# Patient Record
Sex: Male | Born: 1935 | Race: White | Hispanic: No | Marital: Married | Smoking: Never smoker
Health system: Southern US, Community
[De-identification: ages and names within clinical notes are randomized; demographics above are authoritative.]

## PROBLEM LIST (undated history)

## (undated) DIAGNOSIS — I10 Essential (primary) hypertension: Secondary | ICD-10-CM

## (undated) DIAGNOSIS — R188 Other ascites: Secondary | ICD-10-CM

## (undated) DIAGNOSIS — F0390 Unspecified dementia without behavioral disturbance: Secondary | ICD-10-CM

## (undated) DIAGNOSIS — K7460 Unspecified cirrhosis of liver: Secondary | ICD-10-CM

## (undated) DIAGNOSIS — I4891 Unspecified atrial fibrillation: Principal | ICD-10-CM

## (undated) DIAGNOSIS — I499 Cardiac arrhythmia, unspecified: Secondary | ICD-10-CM

## (undated) DIAGNOSIS — R011 Cardiac murmur, unspecified: Secondary | ICD-10-CM

## (undated) HISTORY — DX: Cardiac murmur, unspecified: R01.1

## (undated) HISTORY — DX: Unspecified atrial fibrillation: I48.91

## (undated) HISTORY — PX: JOINT REPLACEMENT: SHX530

## (undated) HISTORY — DX: Essential (primary) hypertension: I10

## (undated) HISTORY — PX: APPENDECTOMY: SHX54

---

## 2004-10-01 ENCOUNTER — Ambulatory Visit: Payer: Self-pay | Admitting: Gastroenterology

## 2008-03-20 ENCOUNTER — Ambulatory Visit: Payer: Self-pay | Admitting: Internal Medicine

## 2008-12-25 ENCOUNTER — Encounter: Payer: Self-pay | Admitting: Cardiovascular Disease

## 2008-12-25 ENCOUNTER — Inpatient Hospital Stay: Payer: Self-pay | Admitting: Internal Medicine

## 2009-03-27 ENCOUNTER — Ambulatory Visit: Payer: Self-pay | Admitting: Gastroenterology

## 2009-06-19 ENCOUNTER — Ambulatory Visit: Payer: Self-pay | Admitting: Cardiovascular Disease

## 2009-06-19 ENCOUNTER — Inpatient Hospital Stay: Payer: Self-pay | Admitting: Specialist

## 2009-07-02 ENCOUNTER — Encounter: Payer: Self-pay | Admitting: Cardiovascular Disease

## 2010-05-31 HISTORY — PX: TOTAL HIP ARTHROPLASTY: SHX124

## 2010-07-10 ENCOUNTER — Encounter: Payer: Self-pay | Admitting: Cardiovascular Disease

## 2010-08-13 ENCOUNTER — Ambulatory Visit: Payer: Self-pay | Admitting: Internal Medicine

## 2010-09-10 ENCOUNTER — Ambulatory Visit (INDEPENDENT_AMBULATORY_CARE_PROVIDER_SITE_OTHER): Payer: Medicare Other | Admitting: Cardiovascular Disease

## 2010-09-10 ENCOUNTER — Encounter: Payer: Self-pay | Admitting: Cardiovascular Disease

## 2010-09-10 DIAGNOSIS — R0600 Dyspnea, unspecified: Secondary | ICD-10-CM

## 2010-09-10 DIAGNOSIS — R0989 Other specified symptoms and signs involving the circulatory and respiratory systems: Secondary | ICD-10-CM

## 2010-09-10 DIAGNOSIS — I4891 Unspecified atrial fibrillation: Secondary | ICD-10-CM

## 2010-09-10 DIAGNOSIS — I10 Essential (primary) hypertension: Secondary | ICD-10-CM

## 2010-09-10 DIAGNOSIS — E785 Hyperlipidemia, unspecified: Secondary | ICD-10-CM | POA: Insufficient documentation

## 2010-09-10 MED ORDER — DIGOXIN 250 MCG PO TABS: 250.0000 ug | ORAL_TABLET | Freq: Every day | ORAL | Status: DC

## 2010-09-10 NOTE — Assessment & Plan Note (Signed)
We have asked him to watch his blood pressure as this is elevated mildly today. He does report adequate blood pressure at home though he does not check it on a regular basis.

## 2010-09-10 NOTE — Assessment & Plan Note (Signed)
His rate today is very fast. I suspect his rate has been poorly controlled for several months given his symptoms of shortness of breath. We will start digoxin 0.5 mg x1 day, then 0.25 mg daily. I asked him to monitor his heart rate and blood pressure for me. I suspect we will need to increase his metoprolol to 75 mg, possibly 50 mg b.i.d. For additional rate control. If his rate continues to be elevated, we can add a calcium channel blocker such as diltiazem.  It is concerning that his rate was previously well controlled and now it is elevated. His wife reports that he has significant stress since the loss of his grandson from a viral myocarditis.

## 2010-09-10 NOTE — Progress Notes (Signed)
   Patient ID: Mark Stephens, male    DOB: 1934/04/08, 77 y.o.   MRN: [id number]  HPI Comments: 77 year old male, patient of Dr. Leane Para a history of atrial fibrillation, hypertension, hyperlipidemia, GI bleed on warfarin in January 2000 and with INR of 7,  presents for evaluation of his shortness of breath,  and mitral valve regurgitation.  He reports that over the past several months, he is having worsening shortness of breath. His wife reports that he cannot do the things that he used to do such as hunting, walking [place] a [place], even his basic daily routine. Previously, he was doing well on his current medication regimen and he believes that something has significantly changed. He denies any edema, cough. He feels well when he sits and sleeps at night, no lightheadedness or dizziness.  Outside studies Suggest moderate mitral valve regurgitation with mild mitral valve prolapse, mildly dilated left atrium, one study suggesting moderate tricuspid valve regurgitation  EKG today showing atrial fibrillation with ventricular rate 130 beats per minute, unable to exclude old anteroseptal infarct     Review of Systems  Constitutional: Negative.   HENT: Negative.   Eyes: Negative.   Respiratory: Positive for shortness of breath.   Cardiovascular: Negative.   Gastrointestinal: Negative.   Musculoskeletal: Negative.   Skin: Negative.   Neurological: Negative.   Hematological: Negative.   Psychiatric/Behavioral: Negative.   All other systems reviewed and are negative.   BP 156/78  Pulse 130  Ht 5\' 11"  (1.803 m)  Wt 175 lb (79.379 kg)  BMI 24.41 kg/m2    Physical Exam  Nursing note and vitals reviewed. Constitutional: He is oriented to person, place, and time. He appears well-developed and well-nourished.  HENT:  Head: Normocephalic.  Nose: Nose normal.  Mouth/Throat: Oropharynx is clear and moist.  Eyes: Conjunctivae are normal.  Pupils are equal, round, and reactive to light.  Neck: Normal range of motion. Neck supple. No JVD present.  Cardiovascular: Normal heart sounds and intact distal pulses.  An irregularly irregular rhythm present. Tachycardia present.  Exam reveals no gallop and no friction rub.   No murmur heard. Pulmonary/Chest: Effort normal and breath sounds normal. No respiratory distress. He has no wheezes. He has no rales. He exhibits no tenderness.  Abdominal: Soft. Bowel sounds are normal. He exhibits no distension. There is no tenderness.  Musculoskeletal: Normal range of motion. He exhibits no edema and no tenderness.  Lymphadenopathy:    He has no cervical adenopathy.  Neurological: He is alert and oriented to person, place, and time. Coordination normal.  Skin: Skin is warm and dry. No rash noted. No erythema.  Psychiatric: He has a normal mood and affect. His behavior is normal. Judgment and thought content normal.           Assessment and Plan

## 2010-09-10 NOTE — Assessment & Plan Note (Signed)
His shortness of breath is likely secondary to his underlying atrial fibrillation with RVR. He may have a significant contribution from his mitral valve regurgitation. If he does not have improvement of his symptoms with rate control, we will have to repeat a echocardiogram, possibly even a stress test.

## 2010-09-10 NOTE — Patient Instructions (Signed)
Start Digoxin 0.25mg  once daily. Take 2 tablets today only, then take 1 tablet once daily thereafter. Monitor your heart rate and write down for about a week and call our office with these numbers. Your physician recommends that you schedule a follow-up appointment in: 1 month

## 2010-09-10 NOTE — Assessment & Plan Note (Signed)
He reports cholesterol 220 or greater on a regular basis. We will talk to him about his cholesterol level at a later date. He reports his father did have "hardening of the arteries."

## 2010-10-14 ENCOUNTER — Ambulatory Visit (INDEPENDENT_AMBULATORY_CARE_PROVIDER_SITE_OTHER): Payer: Medicare Other | Admitting: Cardiovascular Disease

## 2010-10-14 ENCOUNTER — Encounter: Payer: Self-pay | Admitting: Cardiovascular Disease

## 2010-10-14 DIAGNOSIS — E785 Hyperlipidemia, unspecified: Secondary | ICD-10-CM

## 2010-10-14 DIAGNOSIS — R0609 Other forms of dyspnea: Secondary | ICD-10-CM

## 2010-10-14 DIAGNOSIS — I4891 Unspecified atrial fibrillation: Secondary | ICD-10-CM

## 2010-10-14 DIAGNOSIS — I10 Essential (primary) hypertension: Secondary | ICD-10-CM

## 2010-10-14 DIAGNOSIS — R0600 Dyspnea, unspecified: Secondary | ICD-10-CM

## 2010-10-14 MED ORDER — DABIGATRAN ETEXILATE MESYLATE 75 MG PO CAPS: 75.0000 mg | ORAL_CAPSULE | Freq: Two times a day (BID) | ORAL | Status: DC

## 2010-10-14 NOTE — Assessment & Plan Note (Signed)
Shortness of breath has improved with better rate control. As he is asymptomatic now, no need for a diuretic

## 2010-10-14 NOTE — Assessment & Plan Note (Signed)
We have encouraged continued exercise, careful diet management in an effort to lose weight. He is not interested in a cholesterol medication at this time.

## 2010-10-14 NOTE — Patient Instructions (Addendum)
You are doing well. Please start Pradaxa 75 mg twice a day. Hold your Aspirin while taking Pradaxa. Please call us if you have new issues that need to be addressed before your next appt.  We will call you for a follow up Appt. End of July 2012:

## 2010-10-14 NOTE — Assessment & Plan Note (Signed)
Pressure is elevated. We have suggested he monitor his blood pressure at home. Lisinopril can be titrated upward if needed

## 2010-10-14 NOTE — Assessment & Plan Note (Signed)
Heart rate has significantly improved on metoprolol, digoxin. No further changes were made. He is at risk of stroke given his chronic atrial fibrillation. He does have a history of GI bleeding though that was with a supratherapeutic INR. We did discuss the benefits of pradaxa. He will stay on low dose aspirin and start a half dose of pradaxa, 75 mg b.i.d. To start. We could possibly titrate this up later if there is no GI bleeding.

## 2010-10-14 NOTE — Progress Notes (Signed)
   Patient ID: Mark Stephens, male    DOB: 1933/07/12, 77 y.o.   MRN: [id number]  HPI Comments: 77 year old male, patient of Dr. Leane Para a history of atrial fibrillation, hypertension, hyperlipidemia, GI bleed on warfarin in January 2000 and with INR of 7,  h/o shortness of breath and mitral valve regurgitation, presents for routine follow up.  On his last clinic visit, he was started on digoxin for rate control. His rate was initially 130 beats per minute on his last office visit. He reports that currently, he feels much better and his heart rate has slowed down. He has less shortness of breath and has more energy. He is bothered by his hip and is taking Vicodin and receiving cortisone shots.  Outside echo Suggests moderate mitral valve regurgitation with mild mitral valve prolapse, mildly dilated left atrium, one study suggesting moderate tricuspid valve regurgitation  EKG today showing atrial fibrillation with ventricular rate 78 beats per minute, unable to exclude old anteroseptal infarct     Review of Systems  Constitutional: Negative.   HENT: Negative.   Eyes: Negative.   Respiratory: Negative.   Cardiovascular: Negative.   Gastrointestinal: Negative.   Musculoskeletal: Positive for arthralgias and gait problem.  Skin: Negative.   Neurological: Negative.   Hematological: Negative.   Psychiatric/Behavioral: Negative.   All other systems reviewed and are negative.    BP 155/80  Pulse 78  Ht 5\' 11"  (1.803 m)  Wt 172 lb (78.019 kg)  BMI 23.99 kg/m2   Physical Exam  Nursing note and vitals reviewed. Constitutional: He is oriented to person, place, and time. He appears well-developed and well-nourished.  HENT:  Head: Normocephalic.  Nose: Nose normal.  Mouth/Throat: Oropharynx is clear and moist.  Eyes: Conjunctivae are normal. Pupils are equal, round, and reactive to light.  Neck: Normal range of motion. Neck supple. No JVD present.  Cardiovascular: Normal rate,  S1 normal, S2 normal and intact distal pulses.  An irregularly irregular rhythm present. Exam reveals no gallop and no friction rub.   Murmur heard.  Systolic murmur is present with a grade of 2/6  Pulmonary/Chest: Effort normal and breath sounds normal. No respiratory distress. He has no wheezes. He has no rales. He exhibits no tenderness.  Abdominal: Soft. Bowel sounds are normal. He exhibits no distension. There is no tenderness.  Musculoskeletal: Normal range of motion. He exhibits no edema and no tenderness.  Lymphadenopathy:    He has no cervical adenopathy.  Neurological: He is alert and oriented to person, place, and time. Coordination normal.  Skin: Skin is warm and dry. No rash noted. No erythema.  Psychiatric: He has a normal mood and affect. His behavior is normal. Judgment and thought content normal.           Assessment and Plan

## 2010-11-05 ENCOUNTER — Encounter: Payer: Self-pay | Admitting: Cardiovascular Disease

## 2010-11-09 ENCOUNTER — Encounter: Payer: Self-pay | Admitting: Cardiovascular Disease

## 2010-12-11 ENCOUNTER — Other Ambulatory Visit: Payer: Self-pay | Admitting: *Deleted

## 2010-12-11 MED ORDER — DABIGATRAN ETEXILATE MESYLATE 75 MG PO CAPS: 75.0000 mg | ORAL_CAPSULE | Freq: Two times a day (BID) | ORAL | Status: DC

## 2010-12-11 NOTE — Telephone Encounter (Signed)
Pt's wife states that pt wants Pradaxa Rx be changed to [facility]. Pt was seen in office 09/2010. Notified wife I will send in Rx now.

## 2010-12-23 ENCOUNTER — Encounter: Payer: Self-pay | Admitting: Cardiovascular Disease

## 2010-12-23 ENCOUNTER — Encounter: Payer: Self-pay | Admitting: *Deleted

## 2010-12-23 ENCOUNTER — Ambulatory Visit (INDEPENDENT_AMBULATORY_CARE_PROVIDER_SITE_OTHER): Payer: Medicare Other | Admitting: Cardiovascular Disease

## 2010-12-23 VITALS — BP 140/90 | HR 63 | Ht 71.0 in | Wt 169.0 lb

## 2010-12-23 DIAGNOSIS — Z01810 Encounter for preprocedural cardiovascular examination: Secondary | ICD-10-CM | POA: Insufficient documentation

## 2010-12-23 DIAGNOSIS — I10 Essential (primary) hypertension: Secondary | ICD-10-CM

## 2010-12-23 DIAGNOSIS — E785 Hyperlipidemia, unspecified: Secondary | ICD-10-CM

## 2010-12-23 DIAGNOSIS — I4891 Unspecified atrial fibrillation: Secondary | ICD-10-CM

## 2010-12-23 NOTE — Assessment & Plan Note (Signed)
Blood pressure is well controlled on today's visit. No changes made to the medications. 

## 2010-12-23 NOTE — Progress Notes (Signed)
Patient ID: Mark Stephens, male    DOB: 01/26/1934, 77 y.o.   MRN: [id number]  HPI Comments: 77 year old male, patient of Dr. Leane Para a history of atrial fibrillation, hypertension, hyperlipidemia, GI bleed on warfarin in January 2000 and with INR of 7,  h/o shortness of breath and mitral valve regurgitation, presents for routine follow up.  He reports that he feels well. His weight has been relatively well controlled on his current medications. He did start Pradaxa one half of a 75 mg tablet b.i.d. For his atrial fibrillation. He did not want a full pill and was very nervous given his previous GI bleed history as well as nosebleed on warfarin. He denies any bleeding. He is scheduled to have hip replacement surgery. He denies any significant shortness of breath or chest pain with exertion.  Previous Outside echo Suggests moderate mitral valve regurgitation with mild mitral valve prolapse, mildly dilated left atrium, one study suggesting moderate tricuspid valve regurgitation  EKG today showing atrial fibrillation with ventricular rate 63 beats per minute, unable to exclude old anteroseptal infarct   Outpatient Encounter Prescriptions as of 12/23/2010  Medication Sig Dispense Refill  . allopurinol (ZYLOPRIM) 100 MG tablet Take 100 mg by mouth daily.        . dabigatran (PRADAXA) 75 MG CAPS Take 1 capsule (75 mg total) by mouth 2 (two) times daily.  180 capsule  3  . digoxin (LANOXIN) 0.25 MG tablet Take 1 tablet (250 mcg total) by mouth daily.  31 tablet  6  . lisinopril (PRINIVIL,ZESTRIL) 10 MG tablet Take 10 mg by mouth daily.        . metoprolol (TOPROL-XL) 50 MG 24 hr tablet Take 50 mg by mouth daily.        . Red Yeast Rice 600 MG CAPS Take 600 mg by mouth 1 day or 1 dose.        . Multiple Vitamins-Minerals (ICAPS MV PO) Take 1 capsule by mouth daily.           Review of Systems  Constitutional: Negative.   HENT: Negative.   Eyes: Negative.   Respiratory: Negative.     Cardiovascular: Negative.   Gastrointestinal: Negative.   Musculoskeletal: Positive for arthralgias and gait problem.  Skin: Negative.   Neurological: Negative.   Hematological: Negative.   Psychiatric/Behavioral: Negative.   All other systems reviewed and are negative.    BP 140/90  Pulse 63  Ht 5\' 11"  (1.803 m)  Wt 169 lb (76.658 kg)  BMI 23.57 kg/m2   Physical Exam  Nursing note and vitals reviewed. Constitutional: He is oriented to person, place, and time. He appears well-developed and well-nourished.  HENT:  Head: Normocephalic.  Nose: Nose normal.  Mouth/Throat: Oropharynx is clear and moist.  Eyes: Conjunctivae are normal. Pupils are equal, round, and reactive to light.  Neck: Normal range of motion. Neck supple. No JVD present.  Cardiovascular: Normal rate, S1 normal, S2 normal and intact distal pulses.  An irregularly irregular rhythm present. Exam reveals no gallop and no friction rub.   Murmur heard.  Systolic murmur is present with a grade of 2/6  Pulmonary/Chest: Effort normal and breath sounds normal. No respiratory distress. He has no wheezes. He has no rales. He exhibits no tenderness.  Abdominal: Soft. Bowel sounds are normal. He exhibits no distension. There is no tenderness.  Musculoskeletal: Normal range of motion. He exhibits no edema and no tenderness.  Lymphadenopathy:    He has no cervical adenopathy.  Neurological: He is alert and oriented to person, place, and time. Coordination normal.  Skin: Skin is warm and dry. No rash noted. No erythema.  Psychiatric: He has a normal mood and affect. His behavior is normal. Judgment and thought content normal.           Assessment and Plan

## 2010-12-23 NOTE — Assessment & Plan Note (Addendum)
Remains in atrial fibrillation, rate is well-controlled. Continue him on his current medications. We did suggest that he take a full pradaxa 75 mg tab BID. He is reluctant to do this given his history of bleeding in the past. We will check a digoxin level today.

## 2010-12-23 NOTE — Assessment & Plan Note (Signed)
He takes red yeast rice with mild improvement of his cholesterol. We have suggested he try to increase the dose every other day.

## 2010-12-23 NOTE — Patient Instructions (Signed)
You are doing well. No medication changes were made. Please stop pradaxa three days before your hip surgery. Restart pradaxa after the surgery Please call us if you have new issues that need to be addressed before your next appt.  We will call you for a follow up Appt. In 6 months

## 2010-12-23 NOTE — Assessment & Plan Note (Signed)
He would be a low/acceptable risk for his upcoming hip surgery. We have suggested he stop his anticoagulation 3 days prior to surgery and restart pradaxa as soon as possible after the surgery.

## 2010-12-24 LAB — DIGOXIN LEVEL: Digoxin Level: 1.7 ng/mL (ref 0.8–2.0)

## 2011-07-19 ENCOUNTER — Other Ambulatory Visit: Payer: Self-pay | Admitting: Cardiovascular Disease

## 2011-07-19 ENCOUNTER — Other Ambulatory Visit: Payer: Self-pay | Admitting: *Deleted

## 2011-07-19 MED ORDER — DIGOXIN 250 MCG PO TABS: 250.0000 ug | ORAL_TABLET | Freq: Every day | ORAL | Status: DC

## 2011-08-13 ENCOUNTER — Other Ambulatory Visit: Payer: Self-pay | Admitting: Cardiovascular Disease

## 2011-08-13 MED ORDER — DABIGATRAN ETEXILATE MESYLATE 75 MG PO CAPS: 75.0000 mg | ORAL_CAPSULE | Freq: Two times a day (BID) | ORAL | Status: DC

## 2011-08-13 NOTE — Telephone Encounter (Signed)
90days

## 2011-08-25 ENCOUNTER — Encounter: Payer: Self-pay | Admitting: Cardiovascular Disease

## 2011-08-25 ENCOUNTER — Ambulatory Visit (INDEPENDENT_AMBULATORY_CARE_PROVIDER_SITE_OTHER): Payer: Medicare Other | Admitting: Cardiovascular Disease

## 2011-08-25 VITALS — BP 142/88 | HR 89 | Ht 71.0 in | Wt 166.0 lb

## 2011-08-25 DIAGNOSIS — I4891 Unspecified atrial fibrillation: Secondary | ICD-10-CM

## 2011-08-25 DIAGNOSIS — Z79899 Other long term (current) drug therapy: Secondary | ICD-10-CM

## 2011-08-25 DIAGNOSIS — E785 Hyperlipidemia, unspecified: Secondary | ICD-10-CM

## 2011-08-25 DIAGNOSIS — I10 Essential (primary) hypertension: Secondary | ICD-10-CM

## 2011-08-25 MED ORDER — ATORVASTATIN CALCIUM 20 MG PO TABS: 20.0000 mg | ORAL_TABLET | Freq: Every day | ORAL | Status: DC

## 2011-08-25 NOTE — Patient Instructions (Signed)
You are doing well. Please start lipitor 1/2 pill for a few weeks then increase to a full pill.  Please call us if you have new issues that need to be addressed before your next appt.  Your physician wants you to follow-up in: 6 months.  You will receive a reminder letter in the mail two months in advance. If you don't receive a letter, please call our office to schedule the follow-up appointment.

## 2011-08-25 NOTE — Assessment & Plan Note (Signed)
Relatively well rate controlled. No medication changes made. We had a long discussion about increasing the dose of the pradaxa to 150 mg twice a day for stroke prevention. He would like to continue on a low dose given his previous history of GI bleed. He understands the risk of being on a low dose and possible stroke.

## 2011-08-25 NOTE — Assessment & Plan Note (Signed)
Blood pressure is adequate on today's visit. No changes made to the medications. I've asked him to watch his blood pressure at home.

## 2011-08-25 NOTE — Assessment & Plan Note (Signed)
We will start Lipitor 10 mg titrating to 20 mg daily as tolerated. He did not like red yeast rice as it caused gas.

## 2011-08-25 NOTE — Progress Notes (Signed)
Patient ID: Mark Stephens, male    DOB: 18-Nov-1933, 78 y.o.   MRN: [id number]  HPI Comments: 78 year old male, patient of Dr. Leane Para a history of atrial fibrillation, hypertension, hyperlipidemia, GI bleed on warfarin in January 2011 and with INR of 7,  h/o shortness of breath and mitral valve regurgitation, presents for routine follow up. He has had recent hip replacement surgery.  He did start Pradaxa one half of a 75 mg tablet b.i.d. For his atrial fibrillation. He did not want a full pill and was very nervous given his previous GI bleed history as well as nosebleed on warfarin. He denies any bleeding. He denieany significant shortness of breath or chest pain with exertion. he feels well.   Outside echo Suggests moderate mitral valve regurgitation with mild mitral valve prolapse, mildly dilated left atrium, one study suggesting moderate tricuspid valve regurgitation  EKG today showing atrial fibrillation with ventricular rate 89 beats per minute, unable to exclude old anteroseptal infarct   Outpatient Encounter Prescriptions as of 08/25/2011  Medication Sig Dispense Refill  . allopurinol (ZYLOPRIM) 100 MG tablet Take 100 mg by mouth daily.        . dabigatran (PRADAXA) 75 MG CAPS Take 1 capsule (75 mg total) by mouth 2 (two) times daily.  180 capsule  3  . digoxin (LANOXIN) 0.25 MG tablet Take 250 mcg by mouth daily. Take 1/2 pill daily      . lisinopril (PRINIVIL,ZESTRIL) 10 MG tablet Take 10 mg by mouth daily.        . metoprolol (TOPROL-XL) 50 MG 24 hr tablet Take 50 mg by mouth daily.        . Multiple Vitamins-Minerals (ICAPS MV PO) Take 1 capsule by mouth daily.          Review of Systems  Constitutional: Negative.   HENT: Negative.   Eyes: Negative.   Respiratory: Negative.   Cardiovascular: Negative.   Gastrointestinal: Negative.   Musculoskeletal: Positive for arthralgias and gait problem.  Skin: Negative.   Neurological: Negative.   Hematological: Negative.     Psychiatric/Behavioral: Negative.   All other systems reviewed and are negative.    BP 142/88  Pulse 89  Ht 5\' 11"  (1.803 m)  Wt 166 lb (75.297 kg)  BMI 23.15 kg/m2  Physical Exam  Nursing note and vitals reviewed. Constitutional: He is oriented to person, place, and time. He appears well-developed and well-nourished.  HENT:  Head: Normocephalic.  Nose: Nose normal.  Mouth/Throat: Oropharynx is clear and moist.  Eyes: Conjunctivae are normal. Pupils are equal, round, and reactive to light.  Neck: Normal range of motion. Neck supple. No JVD present.  Cardiovascular: Normal rate, S1 normal, S2 normal and intact distal pulses.  An irregularly irregular rhythm present. Exam reveals no gallop and no friction rub.   Murmur heard.  Systolic murmur is present with a grade of 2/6  Pulmonary/Chest: Effort normal and breath sounds normal. No respiratory distress. He has no wheezes. He has no rales. He exhibits no tenderness.  Abdominal: Soft. Bowel sounds are normal. He exhibits no distension. There is no tenderness.  Musculoskeletal: Normal range of motion. He exhibits no edema and no tenderness.  Lymphadenopathy:    He has no cervical adenopathy.  Neurological: He is alert and oriented to person, place, and time. Coordination normal.  Skin: Skin is warm and dry. No rash noted. No erythema.  Psychiatric: He has a normal mood and affect. His behavior is normal. Judgment and thought content  normal.           Assessment and Plan

## 2011-09-06 ENCOUNTER — Telehealth: Payer: Self-pay | Admitting: Cardiovascular Disease

## 2011-09-06 NOTE — Telephone Encounter (Signed)
Patient called to get results from digoxin level done 08/25/11.Patient told will send to [facility] for advice.

## 2011-09-06 NOTE — Telephone Encounter (Signed)
Digoxin level ok, little low but ok The higher dose digoxin caused the level to be too high So think we keep him on 0.125 mg daily for now

## 2011-09-06 NOTE — Telephone Encounter (Signed)
Pt calling for labs results from March

## 2011-09-07 NOTE — Telephone Encounter (Signed)
Advised patient

## 2012-02-04 ENCOUNTER — Other Ambulatory Visit: Payer: Self-pay

## 2012-02-04 MED ORDER — DABIGATRAN ETEXILATE MESYLATE 75 MG PO CAPS: 75.0000 mg | ORAL_CAPSULE | Freq: Two times a day (BID) | ORAL | Status: DC

## 2012-02-04 NOTE — Telephone Encounter (Signed)
Refill sent for pradaxa 75 mg take one tablet twice a day.

## 2012-02-16 ENCOUNTER — Other Ambulatory Visit: Payer: Self-pay | Admitting: Cardiovascular Disease

## 2012-02-16 MED ORDER — DABIGATRAN ETEXILATE MESYLATE 75 MG PO CAPS: 75.0000 mg | ORAL_CAPSULE | Freq: Two times a day (BID) | ORAL | Status: DC

## 2012-02-16 NOTE — Telephone Encounter (Signed)
Refilled Pradaxa. 

## 2012-02-17 ENCOUNTER — Other Ambulatory Visit: Payer: Self-pay

## 2012-02-17 ENCOUNTER — Encounter: Payer: Self-pay | Admitting: Cardiovascular Disease

## 2012-02-17 ENCOUNTER — Ambulatory Visit (INDEPENDENT_AMBULATORY_CARE_PROVIDER_SITE_OTHER): Payer: Medicare Other | Admitting: Cardiovascular Disease

## 2012-02-17 VITALS — BP 110/60 | HR 89 | Ht 71.0 in | Wt 167.2 lb

## 2012-02-17 DIAGNOSIS — E785 Hyperlipidemia, unspecified: Secondary | ICD-10-CM

## 2012-02-17 DIAGNOSIS — I4891 Unspecified atrial fibrillation: Secondary | ICD-10-CM

## 2012-02-17 DIAGNOSIS — I10 Essential (primary) hypertension: Secondary | ICD-10-CM

## 2012-02-17 MED ORDER — DABIGATRAN ETEXILATE MESYLATE 75 MG PO CAPS: 75.0000 mg | ORAL_CAPSULE | Freq: Two times a day (BID) | ORAL | Status: DC

## 2012-02-17 NOTE — Assessment & Plan Note (Signed)
Blood pressure borderline low. If this continues to run low, we have suggested he cut the lisinopril in half

## 2012-02-17 NOTE — Progress Notes (Signed)
Patient ID: Mark Stephens, male    DOB: [birth year], 78 y.o.   MRN: [id number]  HPI Comments: 78 year old male, patient of Dr. Leane Para a history of atrial fibrillation, hypertension, hyperlipidemia, GI bleed on warfarin in January 2011 and with INR of 7,  h/o shortness of breath and mitral valve regurgitation, presents for routine follow up. He has had recent hip replacement surgery.   He currently takes Pradaxa one tablet b.i.d. For his atrial fibrillation.  He denies any bleeding. He denies any significant shortness of breath or chest pain with exertion. he feels well.  He is not exercising on a regular basis. Sometimes his blood pressure runs low. He does occasionally forget his anticoagulation in the p.m. and is interested in daily dosing of xarelto.  Outside echo Suggests moderate mitral valve regurgitation with mild mitral valve prolapse, mildly dilated left atrium, one study suggesting moderate tricuspid valve regurgitation  EKG today showing atrial fibrillation with ventricular rate 89 beats per minute   Outpatient Encounter Prescriptions as of 02/17/2012  Medication Sig Dispense Refill  . allopurinol (ZYLOPRIM) 100 MG tablet Take 100 mg by mouth daily.        . dabigatran (PRADAXA) 75 MG CAPS Take 1 capsule (75 mg total) by mouth 2 (two) times daily.  180 capsule  3  . digoxin (LANOXIN) 0.25 MG tablet Take 250 mcg by mouth daily. Take 1/2 pill daily      . lisinopril (PRINIVIL,ZESTRIL) 10 MG tablet Take 10 mg by mouth daily.        . metoprolol (TOPROL-XL) 50 MG 24 hr tablet Take 50 mg by mouth daily.        . Multiple Vitamins-Minerals (ICAPS MV PO) Take 1 capsule by mouth daily.          Review of Systems  Constitutional: Negative.   HENT: Negative.   Eyes: Negative.   Respiratory: Negative.   Cardiovascular: Negative.   Gastrointestinal: Negative.   Musculoskeletal: Positive for arthralgias and gait problem.  Skin: Negative.   Neurological: Negative.     Hematological: Negative.   Psychiatric/Behavioral: Negative.   All other systems reviewed and are negative.    BP 110/60  Pulse 89  Ht 5\' 11"  (1.803 m)  Wt 167 lb 4 oz (75.864 kg)  BMI 23.33 kg/m2  Physical Exam  Nursing note and vitals reviewed. Constitutional: He is oriented to person, place, and time. He appears well-developed and well-nourished.  HENT:  Head: Normocephalic.  Nose: Nose normal.  Mouth/Throat: Oropharynx is clear and moist.  Eyes: Conjunctivae normal are normal. Pupils are equal, round, and reactive to light.  Neck: Normal range of motion. Neck supple. No JVD present.  Cardiovascular: Normal rate, S1 normal, S2 normal and intact distal pulses.  An irregularly irregular rhythm present. Exam reveals no gallop and no friction rub.   Murmur heard.  Systolic murmur is present with a grade of 2/6  Pulmonary/Chest: Effort normal and breath sounds normal. No respiratory distress. He has no wheezes. He has no rales. He exhibits no tenderness.  Abdominal: Soft. Bowel sounds are normal. He exhibits no distension. There is no tenderness.  Musculoskeletal: Normal range of motion. He exhibits no edema and no tenderness.  Lymphadenopathy:    He has no cervical adenopathy.  Neurological: He is alert and oriented to person, place, and time. Coordination normal.  Skin: Skin is warm and dry. No rash noted. No erythema.  Psychiatric: He has a normal mood and affect. His behavior is normal. Judgment  and thought content normal.           Assessment and Plan

## 2012-02-17 NOTE — Assessment & Plan Note (Signed)
He does not want a statin. We have suggested he continue red yeast rice

## 2012-02-17 NOTE — Assessment & Plan Note (Signed)
Heart rate is well controlled. He is on anticoagulation. 

## 2012-02-17 NOTE — Patient Instructions (Addendum)
You are doing well. If blood pressure runs low, cut the lisinopril in 1/2 daily  When you run out of pradaxa, start xarelto one a day If you feel ok on xarelto, call the office for a script  Please call us if you have new issues that need to be addressed before your next appt.  Your physician wants you to follow-up in: 6 months.  You will receive a reminder letter in the mail two months in advance. If you don't receive a letter, please call our office to schedule the follow-up appointment.

## 2012-03-15 ENCOUNTER — Encounter: Payer: Self-pay | Admitting: Internal Medicine

## 2012-07-15 ENCOUNTER — Other Ambulatory Visit: Payer: Self-pay

## 2012-08-09 ENCOUNTER — Ambulatory Visit (INDEPENDENT_AMBULATORY_CARE_PROVIDER_SITE_OTHER): Payer: Medicare Other | Admitting: Cardiovascular Disease

## 2012-08-09 ENCOUNTER — Encounter: Payer: Self-pay | Admitting: Cardiovascular Disease

## 2012-08-09 VITALS — BP 120/80 | HR 72 | Ht 71.0 in | Wt 163.5 lb

## 2012-08-09 DIAGNOSIS — I10 Essential (primary) hypertension: Secondary | ICD-10-CM

## 2012-08-09 DIAGNOSIS — F1020 Alcohol dependence, uncomplicated: Secondary | ICD-10-CM | POA: Insufficient documentation

## 2012-08-09 DIAGNOSIS — E785 Hyperlipidemia, unspecified: Secondary | ICD-10-CM

## 2012-08-09 DIAGNOSIS — I4891 Unspecified atrial fibrillation: Secondary | ICD-10-CM

## 2012-08-09 MED ORDER — RIVAROXABAN 20 MG PO TABS: 20.0000 mg | ORAL_TABLET | Freq: Every day | ORAL | Status: DC

## 2012-08-09 NOTE — Progress Notes (Signed)
Patient ID: Mark Stephens, male    DOB: [birth year], 79 y.o.   MRN: [id number]  HPI Comments: 79 year old male, patient of Dr. Randa Lynn, with a history of atrial fibrillation, hypertension, hyperlipidemia, GI bleed on warfarin in January 2011, with INR of 7,  h/o shortness of breath and mitral valve regurgitation, presents for routine follow up. prior hip replacement surgery.  He currently takes Pradaxa 75 mg one tablet b.i.d. For his atrial fibrillation.  He denies any bleeding.he did not want a higher dose pradaxa.  He is willing to try xarelto 20 mg daily. This was given to him in the past several months ago but he has not changed. He denies any significant shortness of breath or chest pain with exertion. he feels well.  He is not exercising on a regular basis. Legs are getting weaker, wife reports his balance is poor He continues to drink several glasses of wine per night but wife reports he is doing slightly better recently He does occasionally forget his anticoagulation in the p.m. and is interested in daily dosing of xarelto.  Outside echo Suggests moderate mitral valve regurgitation with mild mitral valve prolapse, mildly dilated left atrium, one study suggesting moderate tricuspid valve regurgitation  Total cholesterol 198, LDL 98, HDL 87 He does not want a cholesterol medication  EKG today showing atrial fibrillation with ventricular rate 72 beats per minute   Outpatient Encounter Prescriptions as of 08/09/2012  Medication Sig Dispense Refill  . allopurinol (ZYLOPRIM) 100 MG tablet Take 100 mg by mouth daily.        . digoxin (LANOXIN) 0.25 MG tablet Take 250 mcg by mouth daily. Take 1/2 pill daily      . lisinopril (PRINIVIL,ZESTRIL) 10 MG tablet Take 10 mg by mouth daily.        . metoprolol (TOPROL-XL) 50 MG 24 hr tablet Take 50 mg by mouth daily.        . Multiple Vitamins-Minerals (ICAPS MV PO) Take 1 capsule by mouth daily.        . Rivaroxaban (XARELTO) 20 MG TABS Take 1  tablet (20 mg total) by mouth daily.  90 tablet  3    Review of Systems  Constitutional: Negative.   HENT: Negative.   Eyes: Negative.   Respiratory: Negative.   Cardiovascular: Negative.   Gastrointestinal: Negative.   Musculoskeletal: Positive for arthralgias and gait problem.  Skin: Negative.   Neurological: Negative.   Psychiatric/Behavioral: Negative.   All other systems reviewed and are negative.    BP 120/80  Pulse 72  Ht 5\' 11"  (1.803 m)  Wt 163 lb 8 oz (74.163 kg)  BMI 22.81 kg/m2  Physical Exam  Nursing note and vitals reviewed. Constitutional: He is oriented to person, place, and time. He appears well-developed and well-nourished.  HENT:  Head: Normocephalic.  Nose: Nose normal.  Mouth/Throat: Oropharynx is clear and moist.  Eyes: Conjunctivae are normal. Pupils are equal, round, and reactive to light.  Neck: Normal range of motion. Neck supple. No JVD present.  Cardiovascular: Normal rate, S1 normal, S2 normal and intact distal pulses.  An irregularly irregular rhythm present. Exam reveals no gallop and no friction rub.   Murmur heard.  Systolic murmur is present with a grade of 2/6  Pulmonary/Chest: Effort normal and breath sounds normal. No respiratory distress. He has no wheezes. He has no rales. He exhibits no tenderness.  Abdominal: Soft. Bowel sounds are normal. He exhibits no distension. There is no tenderness.  Musculoskeletal: Normal  range of motion. He exhibits no edema and no tenderness.  Lymphadenopathy:    He has no cervical adenopathy.  Neurological: He is alert and oriented to person, place, and time. Coordination normal.  Skin: Skin is warm and dry. No rash noted. No erythema.  Psychiatric: He has a normal mood and affect. His behavior is normal. Judgment and thought content normal.      Assessment and Plan

## 2012-08-09 NOTE — Patient Instructions (Signed)
You are doing well. Please change to xarelto 20 mg daily, hold pradaxa  Please call us if you have new issues that need to be addressed before your next appt.  Your physician wants you to follow-up in: 6 months.  You will receive a reminder letter in the mail two months in advance. If you don't receive a letter, please call our office to schedule the follow-up appointment.

## 2012-08-09 NOTE — Assessment & Plan Note (Signed)
Blood pressure is well controlled on today's visit. No changes made to the medications. 

## 2012-08-09 NOTE — Assessment & Plan Note (Signed)
Is not interested in a cholesterol medication

## 2012-08-09 NOTE — Assessment & Plan Note (Signed)
Rate is reasonably well controlled. He will change from pradaxa to xarelto 20 mg daily.

## 2012-08-09 NOTE — Assessment & Plan Note (Signed)
We have encouraged him to watch his alcohol intake

## 2012-08-15 ENCOUNTER — Telehealth: Payer: Self-pay

## 2012-08-15 NOTE — Telephone Encounter (Signed)
error 

## 2012-08-23 ENCOUNTER — Telehealth: Payer: Self-pay

## 2012-08-23 MED ORDER — DIGOXIN 250 MCG PO TABS: 250.0000 ug | ORAL_TABLET | Freq: Every day | ORAL | Status: DC

## 2012-08-23 NOTE — Telephone Encounter (Signed)
Refill sent for digoxin x 90 days supply.

## 2012-08-23 NOTE — Telephone Encounter (Signed)
Pt wife called and states he wants to change his digoxin to 90 day supply.

## 2012-08-24 ENCOUNTER — Other Ambulatory Visit: Payer: Self-pay | Admitting: *Deleted

## 2012-08-24 MED ORDER — DIGOXIN 250 MCG PO TABS: ORAL_TABLET | ORAL | Status: DC

## 2012-08-24 NOTE — Telephone Encounter (Signed)
Refilled Digoxin 90 day supply sent to [facility].

## 2012-08-25 ENCOUNTER — Telehealth: Payer: Self-pay

## 2012-08-25 NOTE — Telephone Encounter (Signed)
Acceptable risk for surgery 

## 2012-08-25 NOTE — Telephone Encounter (Signed)
Received correspondence from [facility] asking for cardiac clearance for cataract extraction under MAC anesthesia

## 2012-08-28 NOTE — Telephone Encounter (Signed)
Faxed to [facility]

## 2012-09-14 ENCOUNTER — Other Ambulatory Visit: Payer: Self-pay

## 2012-09-14 MED ORDER — RIVAROXABAN 20 MG PO TABS: 20.0000 mg | ORAL_TABLET | Freq: Every day | ORAL | Status: DC

## 2012-10-05 ENCOUNTER — Other Ambulatory Visit: Payer: Self-pay | Admitting: *Deleted

## 2012-10-05 MED ORDER — RIVAROXABAN 20 MG PO TABS: 20.0000 mg | ORAL_TABLET | Freq: Every day | ORAL | Status: DC

## 2012-10-05 NOTE — Telephone Encounter (Signed)
Refilled Xarelto 20 mg sent to [facility] pharmacy.

## 2012-10-06 ENCOUNTER — Other Ambulatory Visit: Payer: Self-pay | Admitting: *Deleted

## 2012-10-06 MED ORDER — RIVAROXABAN 20 MG PO TABS: 20.0000 mg | ORAL_TABLET | Freq: Every day | ORAL | Status: DC

## 2012-10-06 NOTE — Telephone Encounter (Signed)
Refill sent for xarelto 90 day supply sent to [facility].

## 2012-10-06 NOTE — Telephone Encounter (Signed)
PATIENT IS REQUESTING 90 DAY SUPPLY. Original was not sent to pharmacy was set to sample. PLEASE send ASAP

## 2013-01-03 ENCOUNTER — Other Ambulatory Visit: Payer: Self-pay

## 2013-04-05 ENCOUNTER — Other Ambulatory Visit: Payer: Self-pay

## 2013-10-17 ENCOUNTER — Other Ambulatory Visit: Payer: Self-pay | Admitting: Cardiovascular Disease

## 2013-10-19 ENCOUNTER — Other Ambulatory Visit: Payer: Self-pay

## 2013-10-19 ENCOUNTER — Other Ambulatory Visit: Payer: Self-pay | Admitting: *Deleted

## 2013-10-19 ENCOUNTER — Other Ambulatory Visit: Payer: Self-pay | Admitting: Cardiovascular Disease

## 2013-10-19 MED ORDER — DIGOXIN 250 MCG PO TABS: ORAL_TABLET | ORAL | Status: DC

## 2013-10-19 NOTE — Telephone Encounter (Signed)
Pt's wife presented to office requesting refill on digoxin.  Advised her that was has not been seen in our office in over a year.  Spoke w/ pt on wife's cell phone.  Advised him that his digoxin level needs to be monitored.  Pt sched appt to see Dr. Mariah Milling 10/23/13 @ 2:30. Sent 15 pills to pt's pharmacy, as he is completely out.

## 2013-10-23 ENCOUNTER — Ambulatory Visit (INDEPENDENT_AMBULATORY_CARE_PROVIDER_SITE_OTHER): Payer: Medicare Other | Admitting: Cardiovascular Disease

## 2013-10-23 ENCOUNTER — Encounter: Payer: Self-pay | Admitting: Cardiovascular Disease

## 2013-10-23 VITALS — BP 150/80 | HR 66 | Ht 71.0 in | Wt 164.0 lb

## 2013-10-23 DIAGNOSIS — I10 Essential (primary) hypertension: Secondary | ICD-10-CM

## 2013-10-23 DIAGNOSIS — R5381 Other malaise: Secondary | ICD-10-CM

## 2013-10-23 DIAGNOSIS — R29818 Other symptoms and signs involving the nervous system: Secondary | ICD-10-CM

## 2013-10-23 DIAGNOSIS — F1020 Alcohol dependence, uncomplicated: Secondary | ICD-10-CM

## 2013-10-23 DIAGNOSIS — R2689 Other abnormalities of gait and mobility: Secondary | ICD-10-CM

## 2013-10-23 DIAGNOSIS — R0602 Shortness of breath: Secondary | ICD-10-CM

## 2013-10-23 DIAGNOSIS — E875 Hyperkalemia: Secondary | ICD-10-CM | POA: Insufficient documentation

## 2013-10-23 DIAGNOSIS — R5383 Other fatigue: Secondary | ICD-10-CM

## 2013-10-23 DIAGNOSIS — R531 Weakness: Secondary | ICD-10-CM

## 2013-10-23 DIAGNOSIS — I4891 Unspecified atrial fibrillation: Secondary | ICD-10-CM

## 2013-10-23 NOTE — Assessment & Plan Note (Signed)
Concerned about the role alcohol is lying on his balance. Recommended he start a balance class, consider going to the gym, starting a regular exercise program. Wife reports he is not motivated

## 2013-10-23 NOTE — Assessment & Plan Note (Addendum)
Have encouraged him to check his blood pressure at home. He is only taking HCTZ as needed. If potassium continues to run high, could change the lisinopril to losartan

## 2013-10-23 NOTE — Assessment & Plan Note (Signed)
I suspect that his alcohol ranging at nighttime, in particular wine, could be contributing to his neuropathy, balance issues. Discussed this with he and his wife

## 2013-10-23 NOTE — Assessment & Plan Note (Signed)
We will recheck his potassium today. He is been taking HCTZ only on an occasional basis when he wants to urinate

## 2013-10-23 NOTE — Assessment & Plan Note (Signed)
Chronic atrial fibrillation. Heart rate relatively well controlled 

## 2013-10-23 NOTE — Progress Notes (Signed)
Patient ID: Mark Stephens, male    DOB: 1934-05-11, 80 y.o.   MRN: [id number]  HPI Comments: 80 year old male with a history of chronic atrial fibrillation, hypertension, hyperlipidemia, GI bleed on warfarin in January 2011, with INR of 7,  h/o shortness of breath and mitral valve regurgitation, presents for routine follow up. prior hip replacement surgery.  He does have a significant alcohol history, drinks numerous glasses of wine per night per his wife  In followup today, his wife reports that his balance is poor. He does not do any regular exercise. He reports that he goes up and down the stairs in his house frequently. No recent falls. Alcohol at nighttime continues to be a problem per the wife. She is very concerned about his balance.  Recent laboratory elevated potassium. He's not been taking his HCTZ on a regular basis, only as needed. He seems to take HCTZ when he feels that he has to urinate. He has not been checking his blood pressure at home. Overall is tolerating xarelto 20 mg daily He reports having numbness in several of his toes  Recent lab work showing total cholesterol 175, LDL 113, HDL 48 He does not want a cholesterol medication  EKG shows atrial fibrillation with ventricular rate 66 beats per minute, no significant ST or T wave changes  He denies any significant shortness of breath or chest pain with exertion.   Outside echo Suggests moderate mitral valve regurgitation with mild mitral valve prolapse, mildly dilated left atrium, one study suggesting moderate tricuspid valve regurgitation       Outpatient Encounter Prescriptions as of 10/23/2013  Medication Sig  . allopurinol (ZYLOPRIM) 100 MG tablet Take 100 mg by mouth daily.    . digoxin (LANOXIN) 0.25 MG tablet Take 1/2 to 1 tablet by mouth daily.  Marland Kitchen lisinopril (PRINIVIL,ZESTRIL) 10 MG tablet Take 10 mg by mouth daily.    . metoprolol (TOPROL-XL) 50 MG 24 hr tablet Take 50 mg by mouth daily.    .  Multiple Vitamins-Minerals (ICAPS MV PO) Take 1 capsule by mouth daily.    . Rivaroxaban (XARELTO) 20 MG TABS Take 1 tablet (20 mg total) by mouth daily.  . tamsulosin (FLOMAX) 0.4 MG CAPS capsule Take 0.4 mg by mouth daily.    Review of Systems  Constitutional: Negative.   HENT: Negative.   Eyes: Negative.   Respiratory: Negative.   Cardiovascular: Negative.   Gastrointestinal: Negative.   Endocrine: Negative.   Musculoskeletal: Positive for arthralgias and gait problem.  Skin: Negative.   Allergic/Immunologic: Negative.   Neurological: Negative.   Hematological: Negative.   Psychiatric/Behavioral: Negative.   All other systems reviewed and are negative.   BP 150/80  Pulse 66  Ht 5\' 11"  (1.803 m)  Wt 164 lb (74.39 kg)  BMI 22.88 kg/m2  Physical Exam  Nursing note and vitals reviewed. Constitutional: He is oriented to person, place, and time. He appears well-developed and well-nourished.  HENT:  Head: Normocephalic.  Nose: Nose normal.  Mouth/Throat: Oropharynx is clear and moist.  Eyes: Conjunctivae are normal. Pupils are equal, round, and reactive to light.  Neck: Normal range of motion. Neck supple. No JVD present.  Cardiovascular: Normal rate, S1 normal, S2 normal and intact distal pulses.  An irregularly irregular rhythm present. Exam reveals no gallop and no friction rub.   Murmur heard.  Systolic murmur is present with a grade of 2/6  Pulmonary/Chest: Effort normal and breath sounds normal. No respiratory distress. He has no wheezes.  He has no rales. He exhibits no tenderness.  Abdominal: Soft. Bowel sounds are normal. He exhibits no distension. There is no tenderness.  Musculoskeletal: Normal range of motion. He exhibits no edema and no tenderness.  Lymphadenopathy:    He has no cervical adenopathy.  Neurological: He is alert and oriented to person, place, and time. Coordination normal.  Skin: Skin is warm and dry. No rash noted. No erythema.  Psychiatric: He  has a normal mood and affect. His behavior is normal. Judgment and thought content normal.      Assessment and Plan

## 2013-10-23 NOTE — Patient Instructions (Signed)
You are doing well. No medication changes were made.  We will check your labs today to check potassium Try to cut  back on the wine  Please call us if you have new issues that need to be addressed before your next appt.  Your physician wants you to follow-up in: 6 months.  You will receive a reminder letter in the mail two months in advance. If you don't receive a letter, please call our office to schedule the follow-up appointment.

## 2013-10-24 LAB — BASIC METABOLIC PANEL
BUN/Creatinine Ratio: 10 (ref 10–22)
BUN: 9 mg/dL (ref 8–27)
CALCIUM: 9.7 mg/dL (ref 8.6–10.2)
CHLORIDE: 94 mmol/L — AB (ref 97–108)
CO2: 24 mmol/L (ref 18–29)
Creatinine, Ser: 0.89 mg/dL (ref 0.76–1.27)
GFR calc Af Amer: 95 mL/min/{1.73_m2} (ref >59)
GFR calc non Af Amer: 82 mL/min/{1.73_m2} (ref >59)
Glucose: 114 mg/dL — ABNORMAL HIGH (ref 65–99)
POTASSIUM: 5.2 mmol/L (ref 3.5–5.2)
SODIUM: 134 mmol/L (ref 134–144)

## 2013-10-26 ENCOUNTER — Ambulatory Visit: Payer: Medicare Other | Admitting: Cardiovascular Disease

## 2013-11-12 ENCOUNTER — Other Ambulatory Visit: Payer: Self-pay | Admitting: Cardiovascular Disease

## 2013-11-12 ENCOUNTER — Telehealth: Payer: Self-pay

## 2013-11-12 MED ORDER — DIGOXIN 250 MCG PO TABS: ORAL_TABLET | ORAL | Status: DC

## 2013-11-12 NOTE — Telephone Encounter (Signed)
Pt's wife requesting refills on pt's digoxin, as they are going out of town next week.  Sent 90 day supply to [facility].

## 2013-11-12 NOTE — Telephone Encounter (Signed)
Pt wife, Rinaldo Cloudamela called and called regarding his Digoxin. Pt wife asks to call as soon as possible.

## 2013-11-12 NOTE — Telephone Encounter (Signed)
Let message for pt or wife to call back.

## 2014-01-08 ENCOUNTER — Other Ambulatory Visit: Payer: Self-pay | Admitting: Cardiovascular Disease

## 2014-06-13 ENCOUNTER — Ambulatory Visit: Payer: 59 | Admitting: Cardiovascular Disease

## 2014-06-27 ENCOUNTER — Ambulatory Visit: Payer: Medicare Other | Admitting: Cardiovascular Disease

## 2014-07-05 ENCOUNTER — Ambulatory Visit: Payer: Self-pay | Admitting: Family Medicine

## 2014-07-10 ENCOUNTER — Ambulatory Visit: Payer: Self-pay | Admitting: Family Medicine

## 2014-07-11 ENCOUNTER — Ambulatory Visit: Payer: Medicare Other | Admitting: Cardiovascular Disease

## 2014-07-23 ENCOUNTER — Encounter: Payer: Self-pay | Admitting: Cardiovascular Disease

## 2014-07-23 ENCOUNTER — Ambulatory Visit (INDEPENDENT_AMBULATORY_CARE_PROVIDER_SITE_OTHER): Payer: Medicare Other | Admitting: Cardiovascular Disease

## 2014-07-23 VITALS — BP 128/80 | HR 80 | Ht 71.0 in | Wt 152.0 lb

## 2014-07-23 DIAGNOSIS — I10 Essential (primary) hypertension: Secondary | ICD-10-CM

## 2014-07-23 DIAGNOSIS — E785 Hyperlipidemia, unspecified: Secondary | ICD-10-CM

## 2014-07-23 DIAGNOSIS — R2689 Other abnormalities of gait and mobility: Secondary | ICD-10-CM

## 2014-07-23 DIAGNOSIS — I4891 Unspecified atrial fibrillation: Secondary | ICD-10-CM

## 2014-07-23 DIAGNOSIS — F1020 Alcohol dependence, uncomplicated: Secondary | ICD-10-CM

## 2014-07-23 NOTE — Assessment & Plan Note (Signed)
Wife reports he is drinking less. He does not feel it is a problem

## 2014-07-23 NOTE — Assessment & Plan Note (Signed)
Most recent lipid panel not available. Currently not on a statin 

## 2014-07-23 NOTE — Assessment & Plan Note (Signed)
Sedentary, significant muscle weakness, difficulty rising from a chair and ambulating. Worse in the past 6 months per the wife. [facility]

## 2014-07-23 NOTE — Assessment & Plan Note (Signed)
Blood pressure is well controlled on today's visit. No changes made to the medications. 

## 2014-07-23 NOTE — Patient Instructions (Signed)
You are doing well. No medication changes were made.  Please increase you walking, daily  Please call us if you have new issues that need to be addressed before your next appt.  Your physician wants you to follow-up in: 6 months.  You will receive a reminder letter in the mail two months in advance. If you don't receive a letter, please call our office to schedule the follow-up appointment.

## 2014-07-23 NOTE — Progress Notes (Signed)
Patient ID: Mark Ramusobert Louis Delcarlo Sr., male    DOB: September 22, 1933, 81 y.o.   MRN: [id number]  HPI Comments: 81 year old male with a history of chronic atrial fibrillation, hypertension, hyperlipidemia, GI bleed on warfarin in January 2011, with INR of 7,  h/o shortness of breath and mitral valve regurgitation, presents for routine follow up of his atrial fibrillation.  prior hip replacement surgery.  He does have a significant alcohol history, drinks numerous glasses of wine per night per his wife  In follow-up today, his wife reports that his balance is poor. No recent falls. He does not do any exercise, very sedentary at baseline. Difficulty getting out of a chair, also staggers. He feels that he is relatively active but wife reports he only gets the mail and that's it, otherwise sits in the house all day When he goes  shopping, he sits in the car, wife does the shopping Wife is interested in him participating in rehabilitation to get stronger as he is getting weaker balance has been a problem for 6 months.   wife reports that he is drinking less, possibly one half glass wine periodically He has not been checking his blood pressure at home. Overall is tolerating xarelto 20 mg daily He reports having numbness in several of his toes He denies any significant shortness of breath or chest pain with exertion.  Recent nosebleeds on one side, comes and goes   EKG on today's visit shows atrial fibrillation with ventricular rate 86 bpm, no significant ST or T-wave changes    other past medical history  previously elevated potassium. He's not been taking his HCTZ on a regular basis, only as needed. He seems to take HCTZ when he feels that he has to urinate. Recent lab work showing total cholesterol 175, LDL 113, HDL 48 He does not want a cholesterol medication  Outside echo Suggests moderate mitral valve regurgitation with mild mitral valve prolapse, mildly dilated left atrium, one study suggesting  moderate tricuspid valve regurgitation    No Known Allergies  Outpatient Encounter Prescriptions as of 07/23/2014  Medication Sig  . allopurinol (ZYLOPRIM) 100 MG tablet Take 100 mg by mouth daily.    . digoxin (LANOXIN) 0.25 MG tablet TAKE ONE-HALF TO ONE TABLET BY MOUTH ONCE DAILY  . lisinopril (PRINIVIL,ZESTRIL) 10 MG tablet Take 10 mg by mouth daily.    . metoprolol (TOPROL-XL) 50 MG 24 hr tablet Take 50 mg by mouth daily.    . Multiple Vitamins-Minerals (ICAPS MV PO) Take 1 capsule by mouth daily.    . Rivaroxaban (XARELTO) 20 MG TABS Take 1 tablet (20 mg total) by mouth daily.  . tamsulosin (FLOMAX) 0.4 MG CAPS capsule Take 0.4 mg by mouth daily.  . [DISCONTINUED] XARELTO 20 MG TABS tablet TAKE 1 TABLET (20 MG TOTAL) BY MOUTH DAILY. (Patient not taking: Reported on 07/23/2014)    Past Medical History  Diagnosis Date  . Hypertension   . Heart murmur   . Atrial fibrillation     Past Surgical History  Procedure Laterality Date  . Appendectomy    . Total hip arthroplasty  2012    right    Social History  reports that he has never smoked. He has never used smokeless tobacco. He reports that he drinks alcohol. He reports that he does not use illicit drugs.  Family History family history includes Alzheimer's disease in his mother; Diabetes in his mother; Hypertension in his mother.   Review of Systems  Constitutional: Negative.  HENT:       Nosebleeds  Respiratory: Negative.   Cardiovascular: Negative.   Gastrointestinal: Negative.   Endocrine: Negative.   Musculoskeletal: Positive for arthralgias and gait problem.  Skin: Negative.   Neurological: Negative.   Hematological: Negative.   Psychiatric/Behavioral: Negative.   All other systems reviewed and are negative.   BP 128/80 mmHg  Pulse 80  Ht  (1.803 m)  Wt 152 lb (68.947 kg)  BMI 21.21 kg/m2  Physical Exam  Constitutional: He is oriented to person, place, and time. He appears well-developed and  well-nourished.  HENT:  Head: Normocephalic.  Nose: Nose normal.  Mouth/Throat: Oropharynx is clear and moist.  Eyes: Conjunctivae are normal. Pupils are equal, round, and reactive to light.  Neck: Normal range of motion. Neck supple. No JVD present.  Cardiovascular: Normal rate, S1 normal, S2 normal and intact distal pulses.  An irregularly irregular rhythm present. Exam reveals no gallop and no friction rub.   Murmur heard.  Systolic murmur is present with a grade of 2/6  Pulmonary/Chest: Effort normal and breath sounds normal. No respiratory distress. He has no wheezes. He has no rales. He exhibits no tenderness.  Abdominal: Soft. Bowel sounds are normal. He exhibits no distension. There is no tenderness.  Musculoskeletal: Normal range of motion. He exhibits no edema or tenderness.  Lymphadenopathy:    He has no cervical adenopathy.  Neurological: He is alert and oriented to person, place, and time. Coordination normal.  Skin: Skin is warm and dry. No rash noted. No erythema.  Psychiatric: He has a normal mood and affect. His behavior is normal. Judgment and thought content normal.      Assessment and Plan   Nursing note and vitals reviewed.

## 2014-07-23 NOTE — Assessment & Plan Note (Signed)
Tolerating anticoagulation, heart rate well controlled, chronic atrial fibrillation

## 2014-08-09 ENCOUNTER — Ambulatory Visit: Payer: Self-pay | Admitting: Neurology

## 2014-10-25 ENCOUNTER — Emergency Department
Admission: EM | Admit: 2014-10-25 | Discharge: 2014-10-25 | Disposition: A | Discharge status: Home / Self Care | Payer: Medicare Other | Attending: Emergency Medicine | Admitting: Emergency Medicine

## 2014-10-25 ENCOUNTER — Encounter: Payer: Self-pay | Admitting: Emergency Medicine

## 2014-10-25 ENCOUNTER — Other Ambulatory Visit: Payer: Self-pay

## 2014-10-25 DIAGNOSIS — M7989 Other specified soft tissue disorders: Secondary | ICD-10-CM | POA: Diagnosis present

## 2014-10-25 DIAGNOSIS — Z792 Long term (current) use of antibiotics: Secondary | ICD-10-CM | POA: Diagnosis not present

## 2014-10-25 DIAGNOSIS — F0390 Unspecified dementia without behavioral disturbance: Secondary | ICD-10-CM | POA: Insufficient documentation

## 2014-10-25 DIAGNOSIS — R609 Edema, unspecified: Secondary | ICD-10-CM | POA: Diagnosis not present

## 2014-10-25 DIAGNOSIS — I10 Essential (primary) hypertension: Secondary | ICD-10-CM | POA: Diagnosis not present

## 2014-10-25 DIAGNOSIS — Z7901 Long term (current) use of anticoagulants: Secondary | ICD-10-CM | POA: Insufficient documentation

## 2014-10-25 DIAGNOSIS — Z79899 Other long term (current) drug therapy: Secondary | ICD-10-CM | POA: Diagnosis not present

## 2014-10-25 DIAGNOSIS — K769 Liver disease, unspecified: Secondary | ICD-10-CM | POA: Diagnosis not present

## 2014-10-25 HISTORY — DX: Unspecified dementia, unspecified severity, without behavioral disturbance, psychotic disturbance, mood disturbance, and anxiety: F03.90

## 2014-10-25 LAB — URINALYSIS COMPLETE WITH MICROSCOPIC (ARMC ONLY)
BILIRUBIN URINE: NEGATIVE
Bacteria, UA: NONE SEEN
Glucose, UA: NEGATIVE mg/dL
HGB URINE DIPSTICK: NEGATIVE
Ketones, ur: NEGATIVE mg/dL
LEUKOCYTES UA: NEGATIVE
NITRITE: NEGATIVE
PROTEIN: 30 mg/dL — AB
SPECIFIC GRAVITY, URINE: 1.017 (ref 1.005–1.030)
pH: 6 (ref 5.0–8.0)

## 2014-10-25 LAB — BASIC METABOLIC PANEL
Anion gap: 7 (ref 5–15)
CO2: 27 mmol/L (ref 22–32)
CREATININE: 0.63 mg/dL (ref 0.61–1.24)
Calcium: 8.6 mg/dL — ABNORMAL LOW (ref 8.9–10.3)
Chloride: 99 mmol/L — ABNORMAL LOW (ref 101–111)
GFR calc non Af Amer: >60 mL/min (ref >60)
Glucose, Bld: 108 mg/dL — ABNORMAL HIGH (ref 65–99)
POTASSIUM: 4.4 mmol/L (ref 3.5–5.1)
Sodium: 133 mmol/L — ABNORMAL LOW (ref 135–145)

## 2014-10-25 LAB — HEPATIC FUNCTION PANEL
ALBUMIN: 3 g/dL — AB (ref 3.5–5.0)
ALK PHOS: 143 U/L — AB (ref 38–126)
ALT: 56 U/L (ref 17–63)
AST: 142 U/L — ABNORMAL HIGH (ref 15–41)
BILIRUBIN DIRECT: 0.9 mg/dL — AB (ref 0.1–0.5)
Indirect Bilirubin: 1.7 mg/dL — ABNORMAL HIGH (ref 0.3–0.9)
TOTAL PROTEIN: 6.6 g/dL (ref 6.5–8.1)
Total Bilirubin: 2.6 mg/dL — ABNORMAL HIGH (ref 0.3–1.2)

## 2014-10-25 LAB — CBC
HCT: 39.9 % — ABNORMAL LOW (ref 40.0–52.0)
HEMOGLOBIN: 13.5 g/dL (ref 13.0–18.0)
MCH: 38.2 pg — AB (ref 26.0–34.0)
MCHC: 33.9 g/dL (ref 32.0–36.0)
MCV: 112.6 fL — ABNORMAL HIGH (ref 80.0–100.0)
PLATELETS: 295 10*3/uL (ref 150–440)
RBC: 3.54 MIL/uL — ABNORMAL LOW (ref 4.40–5.90)
RDW: 14.2 % (ref 11.5–14.5)
WBC: 13.5 10*3/uL — ABNORMAL HIGH (ref 3.8–10.6)

## 2014-10-25 LAB — BRAIN NATRIURETIC PEPTIDE: B Natriuretic Peptide: 209 pg/mL — ABNORMAL HIGH (ref 0.0–100.0)

## 2014-10-25 LAB — CK: Total CK: 34 U/L — ABNORMAL LOW (ref 49–397)

## 2014-10-25 LAB — TROPONIN I: Troponin I: <0.03 ng/mL (ref <0.031)

## 2014-10-25 MED ORDER — FUROSEMIDE 20 MG PO TABS: 20.0000 mg | ORAL_TABLET | Freq: Every day | ORAL | Status: DC

## 2014-10-25 NOTE — ED Notes (Signed)
Pt presents to ED with wife from home with c/o swollen feet. Pt's wife reports that she noticed pt's feet swelling about two days ago. Pt's wife reports that pt has complained of his feet being "cold and numb." Pt's wife notes that pt has history of early dementia. Pt denies pain during triage. Pt is A&O x 4 during triage.

## 2014-10-25 NOTE — ED Provider Notes (Signed)
[facility] Emergency Department Provider Note  ____________________________________________  Time seen: 2015  I have reviewed the triage vital signs and the nursing notes.   HISTORY  Chief Complaint Leg Swelling     HPI Mark Loh Sr. is a 81 y.o. male with dementia, early stages, who presents with his wife concerned about his bilateral pedal edema. He began swelling in both feet and lower legs 4-5 days ago. The symptoms were initially mild but are more moderate at this time. He has not had symptoms like this before. His feet do feel funny, swollen, to the patient. He is not having any pain with his feet. He is not having any shortness of breath or signs of congestive heart failure. He recently saw his primary care physician and is currently being treated for a urinary tract infection with ciprofloxacin. After further discussion, history, and after the physical exam, the wife then lets me know that the patient does have a history of some liver problems. She reports at his abdomen seems to be swollen at times.     Past Medical History  Diagnosis Date  . Hypertension   . Heart murmur   . Atrial fibrillation   . Dementia     Patient Active Problem List   Diagnosis Date Noted  . Poor balance 10/23/2013  . Hyperkalemia 10/23/2013  . ETOHism 08/09/2012  . Preop cardiovascular exam 12/23/2010  . Atrial fibrillation 09/10/2010  . HTN (hypertension) 09/10/2010  . Hyperlipidemia 09/10/2010  . Dyspnea 09/10/2010    Past Surgical History  Procedure Laterality Date  . Appendectomy    . Total hip arthroplasty  2012    right    Current Outpatient Rx  Name  Route  Sig  Dispense  Refill  . allopurinol (ZYLOPRIM) 100 MG tablet   Oral   Take 100 mg by mouth daily.           . ciprofloxacin (CIPRO) 500 MG tablet   Oral   Take 500 mg by mouth 2 (two) times daily.         . digoxin (LANOXIN) 0.25 MG tablet      TAKE ONE-HALF TO ONE TABLET  BY MOUTH ONCE DAILY Patient taking differently: Take 0.125 mg by mouth daily. TAKE ONE-HALF TO ONE TABLET BY MOUTH ONCE DAILY   90 tablet   3   . folic acid (FOLVITE) 400 MCG tablet   Oral   Take 400 mcg by mouth daily.         Marland Kitchen lisinopril (PRINIVIL,ZESTRIL) 20 MG tablet   Oral   Take 20 mg by mouth daily.         . metoprolol (TOPROL-XL) 50 MG 24 hr tablet   Oral   Take 50 mg by mouth daily.           . Rivaroxaban (XARELTO) 20 MG TABS   Oral   Take 1 tablet (20 mg total) by mouth daily.   90 tablet   3   . tamsulosin (FLOMAX) 0.4 MG CAPS capsule   Oral   Take 0.4 mg by mouth daily.         . furosemide (LASIX) 20 MG tablet   Oral   Take 1 tablet (20 mg total) by mouth daily.   12 tablet   11     Allergies Review of patient's allergies indicates no known allergies.  Family History  Problem Relation Age of Onset  . Hypertension Mother   . Diabetes Mother   .  Alzheimer's disease Mother     Social History History  Substance Use Topics  . Smoking status: Never Smoker   . Smokeless tobacco: Never Used  . Alcohol Use: 8.4 oz/week    14 Glasses of wine per week     Comment: occasional    Review of Systems Limited ROS to the patient's dementia but overall he is communicative and seems reliable. He is easily distracted and somewhat confused. Constitutional: Negative for fever. ENT: Negative for sore throat. Cardiovascular: Negative for chest pain. Respiratory: Negative for shortness of breath. Gastrointestinal: Notable for some distention or swelling of his abdomen. Genitourinary: Negative for dysuria. Musculoskeletal: Positive for bilateral edema. Skin: Negative for rash. Neurological: Negative for headaches   10-point ROS otherwise negative.  ____________________________________________   PHYSICAL EXAM:  VITAL SIGNS: ED Triage Vitals  Enc Vitals Group     BP 10/25/14 1508 154/86 mmHg     Pulse Rate 10/25/14 1508 101     Resp 10/25/14  1508 18     Temp 10/25/14 1508 97.8 F (36.6 C)     Temp Source 10/25/14 1508 Oral     SpO2 10/25/14 1508 99 %     Weight 10/25/14 1508 156 lb (70.761 kg)     Height 10/25/14 1508 5\' 11"  (1.803 m)     Head Cir --      Peak Flow --      Pain Score --      Pain Loc --      Pain Edu? --      Excl. in GC? --     Constitutional: Alert, interactive and pleasant but a little bit confused. no distress. ENT   Head: Normocephalic and atraumatic.   Nose: No congestion/rhinnorhea.   Mouth/Throat: Mucous membranes are moist. Cardiovascular: Normal rate, regular rhythm. Respiratory: Normal respiratory effort without tachypnea. Breath sounds are clear and equal bilaterally. No wheezes/rales/rhonchi. Gastrointestinal: Soft and nontender. No distention. There may be mild to moderate ascites present.  Back: No muscle spasm, no tenderness, no CVA tenderness. Musculoskeletal: Nontender with normal range of motion in all extremities.  No noted edema. Neurologic: Mentation and communication consistent with early dementia. No gross focal neurologic deficits are appreciated.  Skin:  Skin is warm, dry. No rash noted. Psychiatric: Overall pleasant and alert but somewhat confused. A little bit baffled by some of the circumstance. Distracted by items on the wall. ____________________________________________    LABS (pertinent positives/negatives)  Urinalysis is negative for infection Metabolic panel overall looks good-sodium 133 potassium 4.4 kidney function is good with a BUN less than 5. Troponin negative CBC shows a white count of 13.5 with a hemoglobin of 13.5. CK level added due to darker urine: 34 Urinalysis: No white blood cells no red blood cells no sign of infection. Liver function tests: AST is elevated 142, ALT is normal at 56, alkaline phosphatase 143. Bilirubin is elevated at 2.6.  ____________________________________________   EKG  ED ECG REPORT I, Mark Stephens, the  attending physician, personally viewed and interpreted this ECG.   Date: 10/25/2014  EKG Time: 1525  Rate: 102  Rhythm: Atrial fibrillation at a rate of 102  Axis: Axis of -23.  Intervals: Normal  ST&T Change: None   ____________________________________________  ____________________________________________   INITIAL IMPRESSION / ASSESSMENT AND PLAN / ED COURSE  Patient with mild to moderate peripheral edema 1-2+ edema on exam. This is most likely related to the patient's history of liver dysfunction. He may have some ascites as well. We have  added on liver function tests to his laboratory assessment. The atrial fibrillation noted on his EKG is not new.   ----------------------------------------- 9:59 PM on 10/25/2014 -----------------------------------------  Liver function tests returned with a slight elevation in AST and in bilirubin. The patient is not having any abdominal pain or sign of biliary disease. With his history of prior liver problems and possible mild ascites I suspect these levels are not unusual for him.  I do think that the edema the patient is experiencing is due to liver issues. We will place him on a diuretic (Lasix 20 mg every morning) and I have counseled them to use compression hose to help reduce the edema in his legs. Again this edema is relatively mild. He needs to follow up as an outpatient for ongoing evaluation and care.  Both the patient and his wife appear agree. They're very pleasant and understanding. ____________________________________________   FINAL CLINICAL IMPRESSION(S) / ED DIAGNOSES  Final diagnoses:  Edema, peripheral  Hyperbilirubinemia  Liver disease  Dementia, without behavioral disturbance     Darien Ramus, MD 10/25/14 2205

## 2014-10-25 NOTE — Discharge Instructions (Signed)
This swelling in her legs and the mild distention in her belly is likely due to liver problems. Your liver function tests came back looking pretty good overall except for a slight elevation in bilirubin at 2.6.  Take Lasix, 20 mg, once a day in the morning, to help reduce the total fluid in your by the and reduce some of the edema and the possible ascites you have. You may wear compression hose as discussed. Follow-up with your regular doctor this coming week. Return to the emergency department if you have worsening swelling or give other urgent concerns.  Cirrhosis Cirrhosis is a condition of scarring of the liver which is caused when the liver has tried repairing itself following damage. This damage may come from a previous infection such as one of the forms of hepatitis (usually hepatitis C), or the damage may come from being injured by toxins. The main toxin that causes this damage is alcohol. The scarring of the liver from use of alcohol is irreversible. That means the liver cannot return to normal even though alcohol is not used any more. The main danger of hepatitis C infection is that it may cause long-lasting (chronic) liver disease, and this also may lead to cirrhosis. This complication is progressive and irreversible. CAUSES  Prior to available blood tests, hepatitis C could be contracted by blood transfusions. Since testing of blood has improved, this is now unlikely. This infection can also be contracted through intravenous drug use and the sharing of needles. It can also be contracted through sexual relationships. The injury caused by alcohol comes from too much use. It is not a few drinks that poison the liver, but years of misuse. Usually there will be some signs and symptoms early with scarring of the liver that suggest the development of better habits. Alcohol should never be used while using acetaminophen. A small dose of both taken together may cause irreversible damage to the  liver. HOME CARE INSTRUCTIONS  There is no specific treatment for cirrhosis. However, there are things you can do to avoid making the condition worse.  Rest as needed.  Eat a well-balanced diet. Your caregiver can help you with suggestions.  Vitamin supplements including vitamins A, K, D, and thiamine can help.  A low-salt diet, water restriction, or diuretic medicine may be needed to reduce fluid retention.  Avoid alcohol. This can be extremely toxic if combined with acetaminophen.  Avoid drugs which are toxic to the liver. Some of these include isoniazid, methyldopa, acetaminophen, anabolic steroids (muscle-building drugs), erythromycin, and oral contraceptives (birth control pills). Check with your caregiver to make sure medicines you are presently taking will not be harmful.  Periodic blood tests may be required. Follow your caregiver's advice regarding the timing of these.  Milk thistle is an herbal remedy which does protect the liver against toxins. However, it will not help once the liver has been scarred. SEEK MEDICAL CARE IF:  You have increasing fatigue or weakness.  You develop swelling of the hands, feet, legs, or face.  You vomit bright red blood, or a coffee ground appearing material.  You have blood in your stools, or the stools turn black and tarry.  You have a fever.  You develop loss of appetite, or have nausea and vomiting.  You develop jaundice.  You develop easy bruising or bleeding.  You have worsening of any of the problems you are concerned about. Document Released: 05/17/2005 Document Revised: 08/09/2011 Document Reviewed: 01/03/2008 [facility] Patient Information 2015 [place], [place].  This information is not intended to replace advice given to you by your health care provider. Make sure you discuss any questions you have with your health care provider.  Edema Edema is an abnormal buildup of fluids. It is more common in your legs and thighs. Painless  swelling of the feet and ankles is more likely as a person ages. It also is common in looser skin, like around your eyes. HOME CARE   Keep the affected body part above the level of the heart while lying down.  Do not sit still or stand for a long time.  Do not put anything right under your knees when you lie down.  Do not wear tight clothes on your upper legs.  Exercise your legs to help the puffiness (swelling) go down.  Wear elastic bandages or support stockings as told by your doctor.  A low-salt diet may help lessen the puffiness.  Only take medicine as told by your doctor. GET HELP IF:  Treatment is not working.  You have heart, liver, or kidney disease and notice that your skin looks puffy or shiny.  You have puffiness in your legs that does not get better when you raise your legs.  You have sudden weight gain for no reason. GET HELP RIGHT AWAY IF:   You have shortness of breath or chest pain.  You cannot breathe when you lie down.  You have pain, redness, or warmth in the areas that are puffy.  You have heart, liver, or kidney disease and get edema all of a sudden.  You have a fever and your symptoms get worse all of a sudden. MAKE SURE YOU:   Understand these instructions.  Will watch your condition.  Will get help right away if you are not doing well or get worse. Document Released: 11/03/2007 Document Revised: 05/22/2013 Document Reviewed: 03/09/2013 [facility] Patient Information 2015 [place], [place]. This information is not intended to replace advice given to you by your health care provider. Make sure you discuss any questions you have with your health care provider.

## 2014-11-15 ENCOUNTER — Other Ambulatory Visit: Payer: Self-pay | Admitting: Family Medicine

## 2014-11-15 DIAGNOSIS — K7031 Alcoholic cirrhosis of liver with ascites: Secondary | ICD-10-CM

## 2014-11-18 ENCOUNTER — Other Ambulatory Visit: Payer: Self-pay | Admitting: Nurse Practitioner

## 2014-11-18 DIAGNOSIS — K7031 Alcoholic cirrhosis of liver with ascites: Secondary | ICD-10-CM

## 2014-11-19 ENCOUNTER — Telehealth: Payer: Self-pay | Admitting: *Deleted

## 2014-11-19 NOTE — Telephone Encounter (Signed)
[facility] following up on a cardiac risk assessment. Having an EDG on Monday.  Please call them once this is done. They faxed it over today.

## 2014-11-20 ENCOUNTER — Other Ambulatory Visit: Payer: Self-pay | Admitting: Nurse Practitioner

## 2014-11-20 DIAGNOSIS — K7400 Hepatic fibrosis, unspecified: Secondary | ICD-10-CM

## 2014-11-20 DIAGNOSIS — R945 Abnormal results of liver function studies: Secondary | ICD-10-CM

## 2014-11-20 DIAGNOSIS — K7031 Alcoholic cirrhosis of liver with ascites: Secondary | ICD-10-CM

## 2014-11-20 DIAGNOSIS — R7989 Other specified abnormal findings of blood chemistry: Secondary | ICD-10-CM

## 2014-11-20 NOTE — Telephone Encounter (Signed)
Received cardiac clearance request from [facility] for pt to proceed w/ EKG on 11/28/14 w/ Dr. Bluford Kaufmann. Per Eula Listen, PA, pt to d/c Xarelto 3 days prior to procedure and restart 24 hrs post procedure or when able per MD. Faxed to [phone].

## 2014-11-22 ENCOUNTER — Ambulatory Visit: Payer: Medicare Other

## 2014-11-22 ENCOUNTER — Ambulatory Visit
Admission: RE | Admit: 2014-11-22 | Discharge: 2014-11-22 | Disposition: A | Discharge status: Home / Self Care | Payer: Medicare Other | Source: Ambulatory Visit

## 2014-11-22 ENCOUNTER — Ambulatory Visit
Admission: RE | Admit: 2014-11-22 | Discharge: 2014-11-22 | Disposition: A | Discharge status: Home / Self Care | Payer: Medicare Other | Source: Ambulatory Visit | Attending: Nurse Practitioner | Admitting: Nurse Practitioner

## 2014-11-22 DIAGNOSIS — R188 Other ascites: Secondary | ICD-10-CM | POA: Insufficient documentation

## 2014-11-22 DIAGNOSIS — R945 Abnormal results of liver function studies: Secondary | ICD-10-CM

## 2014-11-22 DIAGNOSIS — K7460 Unspecified cirrhosis of liver: Secondary | ICD-10-CM | POA: Diagnosis not present

## 2014-11-22 DIAGNOSIS — K760 Fatty (change of) liver, not elsewhere classified: Secondary | ICD-10-CM | POA: Insufficient documentation

## 2014-11-22 DIAGNOSIS — K7400 Hepatic fibrosis, unspecified: Secondary | ICD-10-CM

## 2014-11-22 DIAGNOSIS — R7989 Other specified abnormal findings of blood chemistry: Secondary | ICD-10-CM

## 2014-11-22 DIAGNOSIS — K7031 Alcoholic cirrhosis of liver with ascites: Secondary | ICD-10-CM

## 2014-11-25 ENCOUNTER — Other Ambulatory Visit: Payer: Self-pay | Admitting: Nurse Practitioner

## 2014-11-25 DIAGNOSIS — R188 Other ascites: Secondary | ICD-10-CM

## 2014-11-26 ENCOUNTER — Other Ambulatory Visit: Payer: Self-pay | Admitting: Radiology

## 2014-11-27 ENCOUNTER — Encounter: Payer: Self-pay | Admitting: *Deleted

## 2014-11-27 ENCOUNTER — Ambulatory Visit: Payer: Medicare Other

## 2014-11-27 ENCOUNTER — Ambulatory Visit
Admission: RE | Admit: 2014-11-27 | Discharge: 2014-11-27 | Disposition: A | Discharge status: Home / Self Care | Payer: Medicare Other | Source: Ambulatory Visit | Attending: Nurse Practitioner | Admitting: Nurse Practitioner

## 2014-11-27 DIAGNOSIS — K7031 Alcoholic cirrhosis of liver with ascites: Secondary | ICD-10-CM | POA: Insufficient documentation

## 2014-11-27 DIAGNOSIS — I4891 Unspecified atrial fibrillation: Secondary | ICD-10-CM | POA: Insufficient documentation

## 2014-11-27 DIAGNOSIS — R7989 Other specified abnormal findings of blood chemistry: Secondary | ICD-10-CM

## 2014-11-27 DIAGNOSIS — F1020 Alcohol dependence, uncomplicated: Secondary | ICD-10-CM | POA: Diagnosis not present

## 2014-11-27 DIAGNOSIS — R188 Other ascites: Secondary | ICD-10-CM

## 2014-11-27 DIAGNOSIS — E785 Hyperlipidemia, unspecified: Secondary | ICD-10-CM | POA: Diagnosis not present

## 2014-11-27 DIAGNOSIS — R0600 Dyspnea, unspecified: Secondary | ICD-10-CM | POA: Diagnosis not present

## 2014-11-27 DIAGNOSIS — I10 Essential (primary) hypertension: Secondary | ICD-10-CM | POA: Insufficient documentation

## 2014-11-27 DIAGNOSIS — K7400 Hepatic fibrosis, unspecified: Secondary | ICD-10-CM

## 2014-11-27 DIAGNOSIS — E875 Hyperkalemia: Secondary | ICD-10-CM | POA: Insufficient documentation

## 2014-11-27 DIAGNOSIS — R945 Abnormal results of liver function studies: Secondary | ICD-10-CM

## 2014-11-27 HISTORY — DX: Unspecified cirrhosis of liver: K74.60

## 2014-11-27 HISTORY — DX: Cardiac arrhythmia, unspecified: I49.9

## 2014-11-27 HISTORY — DX: Unspecified atrial fibrillation: I48.91

## 2014-11-27 LAB — BODY FLUID CELL COUNT WITH DIFFERENTIAL
EOS FL: 0 %
Lymphs, Fluid: 70 %
MONOCYTE-MACROPHAGE-SEROUS FLUID: 20 %
Neutrophil Count, Fluid: 10 %
OTHER CELLS FL: 0 %
Total Nucleated Cell Count, Fluid: 237 cu mm

## 2014-11-27 LAB — PROTEIN, BODY FLUID

## 2014-11-27 LAB — GLUCOSE, PERITONEAL FLUID: Glucose, Peritoneal Fluid: 98 mg/dL

## 2014-11-27 LAB — LACTATE DEHYDROGENASE, PLEURAL OR PERITONEAL FLUID: LD FL: 55 U/L — AB (ref 3–23)

## 2014-11-27 NOTE — Procedures (Signed)
Under real-time US guidance, paracentesis was performed. 900 mls of serous fluid removed. No immediate complications.

## 2014-11-28 ENCOUNTER — Encounter: Admission: RE | Payer: Self-pay | Source: Ambulatory Visit

## 2014-11-28 ENCOUNTER — Ambulatory Visit: Admission: RE | Admit: 2014-11-28 | Payer: Medicare Other | Source: Ambulatory Visit | Admitting: Gastroenterology

## 2014-11-28 LAB — ALBUMIN, FLUID (OTHER): Albumin, Fluid: <1 g/dL

## 2014-11-28 SURGERY — EGD (ESOPHAGOGASTRODUODENOSCOPY)
Anesthesia: General

## 2014-11-29 LAB — CYTOLOGY - NON PAP

## 2014-11-29 LAB — PATHOLOGIST SMEAR REVIEW

## 2014-12-13 ENCOUNTER — Telehealth: Payer: Self-pay

## 2014-12-13 NOTE — Telephone Encounter (Signed)
Received cardiac clearance request for pt to proceed w/ colonoscopy/endoscopy on 01/02/15 w/ Dr. Lutricia FeilPaul Oh.  Per Eula Listenyan Dunn, PA, pt may hold Xarelto 2 days prior, restart 24 hrs post procedure or when ok per MD. Faxed to [facility] @ [phone].

## 2015-01-01 ENCOUNTER — Encounter: Payer: Self-pay | Admitting: *Deleted

## 2015-01-02 ENCOUNTER — Ambulatory Visit: Admission: RE | Admit: 2015-01-02 | Payer: Medicare Other | Source: Ambulatory Visit | Admitting: Gastroenterology

## 2015-01-02 ENCOUNTER — Encounter: Admission: RE | Payer: Self-pay | Source: Ambulatory Visit

## 2015-01-02 SURGERY — COLONOSCOPY WITH PROPOFOL
Anesthesia: General

## 2015-01-03 ENCOUNTER — Other Ambulatory Visit: Payer: Self-pay | Admitting: Cardiovascular Disease

## 2015-01-31 ENCOUNTER — Other Ambulatory Visit: Payer: Self-pay | Admitting: Gastroenterology

## 2015-01-31 DIAGNOSIS — R188 Other ascites: Secondary | ICD-10-CM

## 2015-02-03 ENCOUNTER — Emergency Department: Payer: Medicare Other

## 2015-02-03 ENCOUNTER — Emergency Department
Admission: EM | Admit: 2015-02-03 | Discharge: 2015-02-03 | Disposition: A | Discharge status: Home / Self Care | Payer: Medicare Other | Attending: Emergency Medicine | Admitting: Emergency Medicine

## 2015-02-03 DIAGNOSIS — Z79899 Other long term (current) drug therapy: Secondary | ICD-10-CM | POA: Diagnosis not present

## 2015-02-03 DIAGNOSIS — R443 Hallucinations, unspecified: Secondary | ICD-10-CM | POA: Diagnosis present

## 2015-02-03 DIAGNOSIS — Z7901 Long term (current) use of anticoagulants: Secondary | ICD-10-CM | POA: Diagnosis not present

## 2015-02-03 DIAGNOSIS — R410 Disorientation, unspecified: Secondary | ICD-10-CM

## 2015-02-03 DIAGNOSIS — I10 Essential (primary) hypertension: Secondary | ICD-10-CM | POA: Diagnosis not present

## 2015-02-03 LAB — COMPREHENSIVE METABOLIC PANEL WITH GFR
ALT: 25 U/L (ref 17–63)
AST: 58 U/L — ABNORMAL HIGH (ref 15–41)
Albumin: 2.7 g/dL — ABNORMAL LOW (ref 3.5–5.0)
Alkaline Phosphatase: 127 U/L — ABNORMAL HIGH (ref 38–126)
Anion gap: 9 (ref 5–15)
BUN: 8 mg/dL (ref 6–20)
CO2: 29 mmol/L (ref 22–32)
Calcium: 8.4 mg/dL — ABNORMAL LOW (ref 8.9–10.3)
Chloride: 91 mmol/L — ABNORMAL LOW (ref 101–111)
Creatinine, Ser: 0.72 mg/dL (ref 0.61–1.24)
GFR calc Af Amer: >60 mL/min
GFR calc non Af Amer: >60 mL/min
Glucose, Bld: 100 mg/dL — ABNORMAL HIGH (ref 65–99)
Potassium: 3.4 mmol/L — ABNORMAL LOW (ref 3.5–5.1)
Sodium: 129 mmol/L — ABNORMAL LOW (ref 135–145)
Total Bilirubin: 2.5 mg/dL — ABNORMAL HIGH (ref 0.3–1.2)
Total Protein: 6.1 g/dL — ABNORMAL LOW (ref 6.5–8.1)

## 2015-02-03 LAB — CBC WITH DIFFERENTIAL/PLATELET
BASOS ABS: 0.1 10*3/uL (ref 0–0.1)
Basophils Relative: 1 %
Eosinophils Absolute: 0.2 10*3/uL (ref 0–0.7)
Eosinophils Relative: 2 %
HEMATOCRIT: 31.6 % — AB (ref 40.0–52.0)
HEMOGLOBIN: 10.7 g/dL — AB (ref 13.0–18.0)
LYMPHS PCT: 19 %
Lymphs Abs: 2.7 10*3/uL (ref 1.0–3.6)
MCH: 34 pg (ref 26.0–34.0)
MCHC: 33.8 g/dL (ref 32.0–36.0)
MCV: 100.5 fL — AB (ref 80.0–100.0)
MONO ABS: 1.7 10*3/uL — AB (ref 0.2–1.0)
Monocytes Relative: 12 %
NEUTROS PCT: 66 %
Neutro Abs: 9.3 10*3/uL — ABNORMAL HIGH (ref 1.4–6.5)
Platelets: 217 10*3/uL (ref 150–440)
RBC: 3.15 MIL/uL — AB (ref 4.40–5.90)
RDW: 14.5 % (ref 11.5–14.5)
WBC: 14 10*3/uL — ABNORMAL HIGH (ref 3.8–10.6)

## 2015-02-03 LAB — URINALYSIS COMPLETE WITH MICROSCOPIC (ARMC ONLY)
Bacteria, UA: NONE SEEN
Bilirubin Urine: NEGATIVE
Glucose, UA: NEGATIVE mg/dL
Hgb urine dipstick: NEGATIVE
Ketones, ur: NEGATIVE mg/dL
Leukocytes, UA: NEGATIVE
Nitrite: NEGATIVE
Protein, ur: NEGATIVE mg/dL
Specific Gravity, Urine: 1.004 — ABNORMAL LOW (ref 1.005–1.030)
Squamous Epithelial / HPF: NONE SEEN
pH: 6 (ref 5.0–8.0)

## 2015-02-03 LAB — AMMONIA

## 2015-02-03 MED ORDER — SODIUM CHLORIDE 0.9 % IV BOLUS (SEPSIS)
1000.0000 mL | Freq: Once | INTRAVENOUS | Status: AC
  Administered 2015-02-03: 1000 mL via INTRAVENOUS

## 2015-02-03 NOTE — ED Notes (Signed)
Patient transported to CT via stretcher.

## 2015-02-03 NOTE — ED Notes (Signed)
Dr Zenda Alpers in to see pt; wife at bedside

## 2015-02-03 NOTE — ED Notes (Signed)
Wife reports hx of dementia, states that for the past couple days he has increased hallucinations, pt is seeing things and wanting to repetitively touch the desk and is seeing things, wife states that he has a couple appt with dr Bluford Kaufmann for more testing with his cirrhosis, pt also has swelling to his bilat lower extremities

## 2015-02-03 NOTE — ED Provider Notes (Signed)
[facility] Emergency Department Provider Note  ____________________________________________  Time seen: Approximately 227 AM  I have reviewed the triage vital signs and the nursing notes.   HISTORY  Chief Complaint Hallucinations    HPI Mark Stephens Sr. is a 81 y.o. male who comes into the hospital tonight with increased confusion. According to the patient's wife he went to bed at approximately 10 PM and he kept seeing things. She reports that he keep seeing a cursor and asking for her to put it on the screen. The patient's wife reports that yesterday he was seen things on the floor and he keeps talking about those markers on the floor. She reports that this is not normal for him. The patient does have a history of dementia but reports that this is new. The patient's wife has been told that his confusion is due to his liver. The patient has also had some increased foot swelling and low sodium. The patient has had no cough or no urinary symptoms. The patient also has not had any fevers. The patient's wife reports that he is on Xarelto when he fell in the bathroom recently has had some increased bruising. The patient's wife was concerned given this increased confusion so she decided to bring him in for evaluation.   Past Medical History  Diagnosis Date  . Hypertension   . Heart murmur   . Atrial fibrillation 11/27/14  . Dementia   . Dysrhythmia   . Liver cirrhosis     Patient Active Problem List   Diagnosis Date Noted  . Poor balance 10/23/2013  . Hyperkalemia 10/23/2013  . ETOHism 08/09/2012  . Preop cardiovascular exam 12/23/2010  . Atrial fibrillation 09/10/2010  . HTN (hypertension) 09/10/2010  . Hyperlipidemia 09/10/2010  . Dyspnea 09/10/2010    Past Surgical History  Procedure Laterality Date  . Appendectomy    . Total hip arthroplasty  2012    right  . Joint replacement      Current Outpatient Rx  Name  Route  Sig  Dispense  Refill   . allopurinol (ZYLOPRIM) 100 MG tablet   Oral   Take 100 mg by mouth daily.           . digoxin (LANOXIN) 0.25 MG tablet      TAKE ONE-HALF TO ONE TABLET BY MOUTH ONCE DAILY   90 tablet   0   . folic acid (FOLVITE) 400 MCG tablet   Oral   Take 400 mcg by mouth daily.         . furosemide (LASIX) 20 MG tablet   Oral   Take 1 tablet (20 mg total) by mouth daily. Patient taking differently: Take 40 mg by mouth daily.    12 tablet   11   . lisinopril (PRINIVIL,ZESTRIL) 20 MG tablet   Oral   Take 20 mg by mouth daily.         . metoprolol (TOPROL-XL) 50 MG 24 hr tablet   Oral   Take 50 mg by mouth daily.           Marland Kitchen omeprazole (PRILOSEC) 20 MG capsule   Oral   Take 20 mg by mouth daily.         . Rivaroxaban (XARELTO) 20 MG TABS   Oral   Take 1 tablet (20 mg total) by mouth daily.   90 tablet   3   . spironolactone (ALDACTONE) 25 MG tablet   Oral   Take 25 mg by  mouth daily.         . tamsulosin (FLOMAX) 0.4 MG CAPS capsule   Oral   Take 0.4 mg by mouth daily.           Allergies Review of patient's allergies indicates no known allergies.  Family History  Problem Relation Age of Onset  . Hypertension Mother   . Diabetes Mother   . Alzheimer's disease Mother     Social History Social History  Substance Use Topics  . Smoking status: Never Smoker   . Smokeless tobacco: Never Used  . Alcohol Use: 8.4 oz/week    14 Glasses of wine per week     Comment: occasional    Review of Systems Constitutional: increased confusion, No fever/chills Eyes: No visual changes. ENT: No sore throat. Cardiovascular: Denies chest pain. Respiratory: Denies shortness of breath. Gastrointestinal: No abdominal pain.  No nausea, no vomiting.  No diarrhea.  No constipation. Genitourinary: Negative for dysuria. Musculoskeletal: Negative for back pain. Skin: Negative for rash. Neurological: Negative for headaches, focal weakness or numbness.  10-point ROS  otherwise negative.  ____________________________________________   PHYSICAL EXAM:  VITAL SIGNS: ED Triage Vitals  Enc Vitals Group     BP 02/03/15 0126 118/72 mmHg     Pulse Rate 02/03/15 0126 132     Resp 02/03/15 0126 18     Temp 02/03/15 0126 98.1 F (36.7 C)     Temp Source 02/03/15 0126 Oral     SpO2 02/03/15 0126 100 %     Weight 02/03/15 0126 137 lb (62.143 kg)     Height 02/03/15 0126 5\' 11"  (1.803 m)     Head Cir --      Peak Flow --      Pain Score --      Pain Loc --      Pain Edu? --      Excl. in GC? --     Constitutional: Alert and oriented to self. Well appearing and in no acute distress. Eyes: Conjunctivae are normal. PERRL. EOMI. Head: Atraumatic. Nose: No congestion/rhinnorhea. Mouth/Throat: Mucous membranes are moist.  Oropharynx non-erythematous. Cardiovascular: Normal rate, regular rhythm. Grossly normal heart sounds.  Good peripheral circulation. Respiratory: Normal respiratory effort.  No retractions. Lungs CTAB. Gastrointestinal: Soft and nontender. No distention. Positive bowel sounds Musculoskeletal: No lower extremity tenderness nor edema.  No joint effusions. Neurologic:  Normal speech and language. No gross focal neurologic deficits are appreciated.  Skin:  Skin is warm, dry and intact. No rash noted. Psychiatric: Mood and affect are normal.   ____________________________________________   LABS (all labs ordered are listed, but only abnormal results are displayed)  Labs Reviewed  CBC WITH DIFFERENTIAL/PLATELET - Abnormal; Notable for the following:    WBC 14.0 (*)    RBC 3.15 (*)    Hemoglobin 10.7 (*)    HCT 31.6 (*)    MCV 100.5 (*)    Neutro Abs 9.3 (*)    Monocytes Absolute 1.7 (*)    All other components within normal limits  COMPREHENSIVE METABOLIC PANEL - Abnormal; Notable for the following:    Sodium 129 (*)    Potassium 3.4 (*)    Chloride 91 (*)    Glucose, Bld 100 (*)    Calcium 8.4 (*)    Total Protein 6.1 (*)     Albumin 2.7 (*)    AST 58 (*)    Alkaline Phosphatase 127 (*)    Total Bilirubin 2.5 (*)    All other  components within normal limits  AMMONIA - Abnormal; Notable for the following:    Ammonia <9 (*)    All other components within normal limits  URINALYSIS COMPLETEWITH MICROSCOPIC ([facility] ONLY) - Abnormal; Notable for the following:    Color, Urine YELLOW (*)    APPearance CLEAR (*)    Specific Gravity, Urine 1.004 (*)    All other components within normal limits   ____________________________________________  EKG  none ____________________________________________  RADIOLOGY  CT Head: No acute intracranial abnormalities chronic atrophy and small vessel ischemic changes CXR: CArdiac enlargement, Small b/l pleural effusions with basilar atelectasis ____________________________________________   PROCEDURES  Procedure(s) performed: None  Critical Care performed: No  ____________________________________________   INITIAL IMPRESSION / ASSESSMENT AND PLAN / ED COURSE  Pertinent labs & imaging results that were available during my care of the patient were reviewed by me and considered in my medical decision making (see chart for details).  This is 81 year old male with a history of dementia who comes in with increased confusion which is abnormal for him. We did check the patient's ammonia as he does have some history of cirrhosis. The patient does have some low sodium but not severe. Patient's urinalysis is unremarkable and the remainder of his labs are also unremarkable. I am unsure the cause of this patient's symptoms at this time. I have offered for the patient to be seen by psych for further evaluation and the patient's wife is unsure if she wants to stay for that. I will give the patient liter of normal saline and reassess the patient.  I offered to have the patient seen by psych for the increased confusion and psychosis but the patient's wife would like to be discharged to  home and f/u with his PCP. He will be discharged to home. ____________________________________________   FINAL CLINICAL IMPRESSION(S) / ED DIAGNOSES  Final diagnoses:  Confusion  Hallucinations      Rebecka Apley, MD 02/03/15 [phone]

## 2015-02-03 NOTE — Discharge Instructions (Signed)
Altered Mental Status Altered mental status most often refers to an abnormal change in your responsiveness and awareness. It can affect your speech, thought, mobility, memory, attention span, or alertness. It can range from slight confusion to complete unresponsiveness (coma). Altered mental status can be a sign of a serious underlying medical condition. Rapid evaluation and medical treatment is necessary for patients having an altered mental status. CAUSES   Low blood sugar (hypoglycemia) or diabetes.  Severe loss of body fluids (dehydration) or a body salt (electrolyte) imbalance.  A stroke or other neurologic problem, such as dementia or delirium.  A head injury or tumor.  A drug or alcohol overdose.  Exposure to toxins or poisons.  Depression, anxiety, and stress.  A low oxygen level (hypoxia).  An infection.  Blood loss.  Twitching or shaking (seizure).  Heart problems, such as heart attack or heart rhythm problems (arrhythmias).  A body temperature that is too low or too high (hypothermia or hyperthermia). DIAGNOSIS  A diagnosis is based on your history, symptoms, physical and neurologic examinations, and diagnostic tests. Diagnostic tests may include:  Measurement of your blood pressure, pulse, breathing, and oxygen levels (vital signs).  Blood tests.  Urine tests.  X-ray exams.  A computerized magnetic scan (magnetic resonance imaging, MRI).  A computerized X-ray scan (computed tomography, CT scan). TREATMENT  Treatment will depend on the cause. Treatment may include:  Management of an underlying medical or mental health condition.  Critical care or support in the hospital. [place]   Only take over-the-counter or prescription medicines for pain, discomfort, or fever as directed by your caregiver.  Manage underlying conditions as directed by your caregiver.  Eat a healthy, well-balanced diet to maintain strength.  Join a support group or  prevention program to cope with the condition or trauma that caused the altered mental status. Ask your caregiver to help choose a program that works for you.  Follow up with your caregiver for further examination, therapy, or testing as directed. SEEK MEDICAL CARE IF:   You feel unwell or have chills.  You or your family notice a change in your behavior or your alertness.  You have trouble following your caregiver's treatment plan.  You have questions or concerns. SEEK IMMEDIATE MEDICAL CARE IF:   You have a rapid heartbeat or have chest pain.  You have difficulty breathing.  You have a fever.  You have a headache with a stiff neck.  You cough up blood.  You have blood in your urine or stool.  You have severe agitation or confusion. MAKE SURE YOU:   Understand these instructions.  Will watch your condition.  Will get help right away if you are not doing well or get worse. Document Released: 11/04/2009 Document Revised: 08/09/2011 Document Reviewed: 11/04/2009 [facility] Patient Information 2015 [place], [place]. This information is not intended to replace advice given to you by your health care provider. Make sure you discuss any questions you have with your health care provider.  Confusion Confusion is the inability to think with your usual speed or clarity. Confusion may come on quickly or slowly over time. How quickly the confusion comes on depends on the cause. Confusion can be due to any number of causes. CAUSES   Concussion, head injury, or head trauma.  Seizures.  Stroke.  Fever.  Brain tumor.  Age related decreased brain function (dementia).  Heightened emotional states like rage or terror.  Mental illness in which the person loses the ability to determine  what is real and what is not (hallucinations). °· Infections such as a urinary tract infection (UTI). °· Toxic effects from alcohol, drugs, or prescription medicines. °· Dehydration and an imbalance of  salts in the body (electrolytes). °· Lack of sleep. °· Low blood sugar (diabetes). °· Low levels of oxygen from conditions such as chronic lung disorders. °· Drug interactions or other medicine side effects. °· Nutritional deficiencies, especially niacin, thiamine, vitamin C, or vitamin B. °· Sudden drop in body temperature (hypothermia). °· Change in routine, such as when traveling or hospitalized. °SIGNS AND SYMPTOMS  °People often describe their thinking as cloudy or unclear when they are confused. Confusion can also include feeling disoriented. That means you are unaware of where or who you are. You may also not know what the date or time is. If confused, you may also have difficulty paying attention, remembering, and making decisions. Some people also act aggressively when they are confused.  °DIAGNOSIS  °The medical evaluation of confusion may include: °· Blood and urine tests. °· X-rays. °· Brain and nervous system tests. °· Analyzing your brain waves (electroencephalogram or EEG). °· Magnetic resonance imaging (MRI) of your head. °· Computed tomography (CT) scan of your head. °· Mental status tests in which your health care provider may ask many questions. Some of these questions may seem silly or strange, but they are a very important test to help diagnose and treat confusion. °TREATMENT  °An admission to the hospital may not be needed, but a person with confusion should not be left alone. Stay with a family member or friend until the confusion clears. Avoid alcohol, pain relievers, or sedative drugs until you have fully recovered. Do not drive until directed by your health care provider. °HOME CARE INSTRUCTIONS  °What family and friends can do: °· To find out if someone is confused, ask the person to state his or her name, age, and the date. If the person is unsure or answers incorrectly, he or she is confused. °· Always introduce yourself, no matter how well the person knows you. °· Often remind the  person of his or her location. °· Place a calendar and clock near the confused person. °· Help the person with his or her medicines. You may want to use a pill box, an alarm as a reminder, or give the person each dose as prescribed. °· Talk about current events and plans for the day. °· Try to keep the environment calm, quiet, and peaceful. °· Make sure the person keeps follow-up visits with his or her health care provider. °PREVENTION  °Ways to prevent confusion: °· Avoid alcohol. °· Eat a balanced diet. °· Get enough sleep. °· Take medicine only as directed by your health care provider. °· Do not become isolated. Spend time with other people and make plans for your days. °· Keep careful watch on your blood sugar levels if you are diabetic. °SEEK IMMEDIATE MEDICAL CARE IF:  °· You develop severe headaches, repeated vomiting, seizures, blackouts, or slurred speech. °· There is increasing confusion, weakness, numbness, restlessness, or personality changes. °· You develop a loss of balance, have marked dizziness, feel uncoordinated, or fall. °· You have delusions, hallucinations, or develop severe anxiety. °· Your family members think you need to be rechecked. °Document Released: 06/24/2004 Document Revised: 10/01/2013 Document Reviewed: 06/22/2013 °[employer]® Patient Information ©2015 [employer]. This information is not intended to replace advice given to you by your health care provider. Make sure you discuss any questions you have   with your health care provider.  Psychosis Psychosis refers to a severe lack of understanding with reality. During a psychotic episode, you are not able to think clearly. During a psychotic episode, your responses and emotions are inappropriate and do not coincide with what is actually happening. You often have false beliefs about what is happening or who you are (delusions), and you may see, hear, taste, smell, or feel things that are not present (hallucinations). Psychosis is  usually a severe symptom of a very serious mental health (psychiatric) condition, but it can sometimes be the result of a medical condition. CAUSES   Psychiatric conditions, such as:  Schizophrenia.  Bipolar disorder.  Depression.  Personality disorders.  Alcohol or drug abuse.  Medical conditions, such as:  Brain injury.  Brain tumor.  Dementia.  Brain diseases, such as Alzheimer's, Parkinson's, or Huntington's disease.  Neurological diseases, such as epilepsy.  Genetic disorders.  Metabolic disorders.  Infections that affect the brain.  Certain prescription drugs.  Stroke. SYMPTOMS   Unable to think or speak clearly or respond appropriately.  Disorganized thinking (thoughts jump from one thought to another).  Severe inappropriate behavior.  Delusions may include:  A strong belief that is odd, unrealistic, or false.  Feeling extremely fearful or suspicious (paranoid).  Believing you are someone else, have high importance, or have an altered identity.  Hallucinations. DIAGNOSIS   Mental health evaluation.  Physical exam.  Blood tests.  Computerized magnetic scan (MRI) or other brain scans. TREATMENT  Your caregiver will recommend a course of treatment that depends on the cause of the psychosis. Treatment may include:  Monitoring and supportive care in the hospital.  Taking medicines (antipsychotic medicine) to reduce symptoms and balance chemicals in the brain.  Taking medicines to manage underlying mental health conditions.  Therapy and other supportive programs outside of the hospital.  Treating an underlying medical condition. If the cause of the psychosis can be treated or corrected, the outlook is good. Without treatment, psychotic episodes can cause danger to yourself or others. Treatment may be short-term or lifelong. HOME CARE INSTRUCTIONS   Take all medicines as directed. This is important.  Use a pillbox or write down your  medicine schedule to make sure you are taking them.  Check with your caregiver before using over-the-counter medicines, herbs, or supplements.  Seek individual and family support through therapy and mental health education (psychoeducation) programs. These will help you manage symptoms and side effects of medicines, learn life skills, and maintain a healthy routine.  Maintain a healthy lifestyle.  Exercise regularly.  Avoid alcohol and drugs.  Learn ways to reduce stress and cope with stress, such as yoga and meditation.  Talk about your feelings with family members or caregivers.  Make time for yourself to do things you enjoy.  Know the early warning signs of psychosis. Your caregiver will recommend steps to take when you notice symptoms such as:  Feeling anxious or preoccupied.  Having racing thoughts.  Changes in your interest in life and relationships.  Follow up with your caregivers for continued outpatient treatment as directed. SEEK MEDICAL CARE IF:   Medicines do not seem to be helping.  You hear voices telling you to do things.  You see, smell, or feel things that are not there.  You feel hopeless and overwhelmed.  You feel extremely fearful and suspicious that something will harm you.  You feel like you cannot leave your house.  You have trouble taking care of yourself.  You  experience side effects of medicines, such as changes in sleep patterns, dizziness, weight gain, restlessness, movement changes, muscle spasms, or tremors. SEEK IMMEDIATE MEDICAL CARE IF:  Severe psychotic symptoms present a safety issue (such as an urge to hurt yourself or others). MAKE SURE YOU:   Understand these instructions.  Will watch your condition.  Will get help right away if you are not doing well or get worse. FOR MORE INFORMATION  National Institute of Mental Health: http://www.maynard.net/ Document Released: 11/04/2009 Document Revised: 08/09/2011 Document Reviewed:  11/04/2009 [facility] Patient Information 2015 [place], [place]. This information is not intended to replace advice given to you by your health care provider. Make sure you discuss any questions you have with your health care provider.

## 2015-02-03 NOTE — ED Notes (Signed)
Patient transported to X-ray via stretcher 

## 2015-02-03 NOTE — ED Notes (Signed)
As requested by MD: in to speak with pt regarding results and further plan of car; pt's wife asking about MD prescribing PT, OT and something to help pt sleep at night; discussed this with MD who will go in to speak with pt/wife as soon as she is available; wife informed of same

## 2015-02-06 ENCOUNTER — Other Ambulatory Visit: Payer: Self-pay | Admitting: Radiology

## 2015-02-06 ENCOUNTER — Ambulatory Visit: Payer: Medicare Other | Admitting: Anesthesiology

## 2015-02-06 ENCOUNTER — Encounter
Admission: RE | Disposition: A | Discharge status: Home / Self Care | Payer: Self-pay | Source: Ambulatory Visit | Attending: Gastroenterology

## 2015-02-06 ENCOUNTER — Other Ambulatory Visit: Payer: Self-pay | Admitting: Nurse Practitioner

## 2015-02-06 ENCOUNTER — Ambulatory Visit
Admission: RE | Admit: 2015-02-06 | Discharge: 2015-02-06 | Disposition: A | Discharge status: Home / Self Care | Payer: Medicare Other | Source: Ambulatory Visit | Attending: Gastroenterology | Admitting: Gastroenterology

## 2015-02-06 ENCOUNTER — Encounter: Payer: Self-pay | Admitting: Anesthesiology

## 2015-02-06 DIAGNOSIS — K769 Liver disease, unspecified: Secondary | ICD-10-CM

## 2015-02-06 DIAGNOSIS — Z79899 Other long term (current) drug therapy: Secondary | ICD-10-CM | POA: Diagnosis not present

## 2015-02-06 DIAGNOSIS — Z7901 Long term (current) use of anticoagulants: Secondary | ICD-10-CM | POA: Diagnosis not present

## 2015-02-06 DIAGNOSIS — K7031 Alcoholic cirrhosis of liver with ascites: Secondary | ICD-10-CM

## 2015-02-06 DIAGNOSIS — R1011 Right upper quadrant pain: Secondary | ICD-10-CM

## 2015-02-06 DIAGNOSIS — I4891 Unspecified atrial fibrillation: Secondary | ICD-10-CM | POA: Insufficient documentation

## 2015-02-06 DIAGNOSIS — Z833 Family history of diabetes mellitus: Secondary | ICD-10-CM | POA: Insufficient documentation

## 2015-02-06 DIAGNOSIS — K766 Portal hypertension: Secondary | ICD-10-CM | POA: Diagnosis not present

## 2015-02-06 DIAGNOSIS — Z9049 Acquired absence of other specified parts of digestive tract: Secondary | ICD-10-CM | POA: Insufficient documentation

## 2015-02-06 DIAGNOSIS — Z820 Family history of epilepsy and other diseases of the nervous system: Secondary | ICD-10-CM | POA: Diagnosis not present

## 2015-02-06 DIAGNOSIS — K222 Esophageal obstruction: Secondary | ICD-10-CM | POA: Diagnosis not present

## 2015-02-06 DIAGNOSIS — Z8249 Family history of ischemic heart disease and other diseases of the circulatory system: Secondary | ICD-10-CM | POA: Insufficient documentation

## 2015-02-06 DIAGNOSIS — F0390 Unspecified dementia without behavioral disturbance: Secondary | ICD-10-CM | POA: Diagnosis not present

## 2015-02-06 DIAGNOSIS — I10 Essential (primary) hypertension: Secondary | ICD-10-CM | POA: Insufficient documentation

## 2015-02-06 DIAGNOSIS — K7460 Unspecified cirrhosis of liver: Secondary | ICD-10-CM | POA: Insufficient documentation

## 2015-02-06 DIAGNOSIS — R634 Abnormal weight loss: Secondary | ICD-10-CM

## 2015-02-06 DIAGNOSIS — K3189 Other diseases of stomach and duodenum: Secondary | ICD-10-CM | POA: Insufficient documentation

## 2015-02-06 DIAGNOSIS — F1010 Alcohol abuse, uncomplicated: Secondary | ICD-10-CM

## 2015-02-06 DIAGNOSIS — Z96641 Presence of right artificial hip joint: Secondary | ICD-10-CM | POA: Insufficient documentation

## 2015-02-06 DIAGNOSIS — Z9660 Presence of unspecified orthopedic joint implant: Secondary | ICD-10-CM | POA: Insufficient documentation

## 2015-02-06 HISTORY — PX: ESOPHAGOGASTRODUODENOSCOPY (EGD) WITH PROPOFOL: SHX5813

## 2015-02-06 SURGERY — ESOPHAGOGASTRODUODENOSCOPY (EGD) WITH PROPOFOL
Anesthesia: General

## 2015-02-06 MED ORDER — SODIUM CHLORIDE 0.9 % IV SOLN
INTRAVENOUS | Status: DC
  Administered 2015-02-06: 1000 mL via INTRAVENOUS

## 2015-02-06 MED ORDER — SODIUM CHLORIDE 0.9 % IV SOLN: INTRAVENOUS | Status: DC

## 2015-02-06 MED ORDER — LIDOCAINE HCL (CARDIAC) 20 MG/ML IV SOLN
INTRAVENOUS | Status: DC | PRN
  Administered 2015-02-06: 100 mg via INTRAVENOUS

## 2015-02-06 MED ORDER — PROPOFOL 10 MG/ML IV BOLUS
INTRAVENOUS | Status: DC | PRN
  Administered 2015-02-06: 20 mg via INTRAVENOUS
  Administered 2015-02-06: 30 mg via INTRAVENOUS

## 2015-02-06 NOTE — Transfer of Care (Signed)
Immediate Anesthesia Transfer of Care Note  Patient: Mark Harman Sr.  Procedure(s) Performed: Procedure(s): ESOPHAGOGASTRODUODENOSCOPY (EGD) WITH PROPOFOL (N/A)  Patient Location: PACU  Anesthesia Type:General  Level of Consciousness: sedated  Airway & Oxygen Therapy: Patient Spontanous Breathing and Patient connected to face mask oxygen  Post-op Assessment: Report given to RN and Post -op Vital signs reviewed and stable  Post vital signs: Reviewed and stable  Last Vitals:  Filed Vitals:   02/06/15 1425  BP: 110/63  Pulse: 57  Temp: 36.1 C  Resp: 17    Complications: No apparent anesthesia complications

## 2015-02-06 NOTE — Anesthesia Preprocedure Evaluation (Signed)
Anesthesia Evaluation  Patient identified by MRN, date of birth, ID band Patient awake    Reviewed: Allergy & Precautions, H&P , NPO status , Patient's Chart, lab work & pertinent test results  History of Anesthesia Complications Negative for: history of anesthetic complications  Airway Mallampati: III  TM Distance: >3 FB Neck ROM: limited    Dental  (+) Poor Dentition, Chipped   Pulmonary shortness of breath,    Pulmonary exam normal breath sounds clear to auscultation       Cardiovascular Exercise Tolerance: Good hypertension, (-) Past MI + dysrhythmias Atrial Fibrillation + Valvular Problems/Murmurs  Rhythm:regular Rate:Normal + Systolic murmurs    Neuro/Psych PSYCHIATRIC DISORDERS negative neurological ROS     GI/Hepatic negative GI ROS, Neg liver ROS,   Endo/Other  negative endocrine ROS  Renal/GU negative Renal ROS  negative genitourinary   Musculoskeletal   Abdominal   Peds  Hematology negative hematology ROS (+)   Anesthesia Other Findings Past Medical History:   Hypertension                                                 Heart murmur                                                 Atrial fibrillation                             11/27/14     Dementia                                                     Dysrhythmia                                                  Liver cirrhosis                                              Reproductive/Obstetrics negative OB ROS                             Anesthesia Physical Anesthesia Plan  ASA: III  Anesthesia Plan: General   Post-op Pain Management:    Induction:   Airway Management Planned:   Additional Equipment:   Intra-op Plan:   Post-operative Plan:   Informed Consent: I have reviewed the patients History and Physical, chart, labs and discussed the procedure including the risks, benefits and alternatives for the proposed  anesthesia with the patient or authorized representative who has indicated his/her understanding and acceptance.   Dental Advisory Given  Plan Discussed with: Anesthesiologist, CRNA and Surgeon  Anesthesia Plan Comments:         Anesthesia Quick Evaluation

## 2015-02-06 NOTE — Op Note (Signed)
[facility] Gastroenterology Patient Name: Mark Stephens Procedure Date: 02/06/2015 2:12 PM MRN: [id number] Account #: [id number] Date of Birth: 03/23/1934 Admit Type: Outpatient Age: 81 Room: [facility] ENDO ROOM 4 Gender: Male Note Status: Finalized Procedure:         Upper GI endoscopy Indications:       livere cirrhosis Providers:         Ezzard Standing. Bluford Kaufmann, MD Referring MD:      Hassell Halim (Referring MD) Medicines:         Monitored Anesthesia Care Complications:     No immediate complications. Procedure:         Pre-Anesthesia Assessment:                    - Prior to the procedure, a History and Physical was                     performed, and patient medications, allergies and                     sensitivities were reviewed. The patient's tolerance of                     previous anesthesia was reviewed.                    - The risks and benefits of the procedure and the sedation                     options and risks were discussed with the patient. All                     questions were answered and informed consent was obtained.                    - After reviewing the risks and benefits, the patient was                     deemed in satisfactory condition to undergo the procedure.                    After obtaining informed consent, the endoscope was passed                     under direct vision. Throughout the procedure, the                     patient's blood pressure, pulse, and oxygen saturations                     were monitored continuously. The Olympus GIF-160 endoscope                     (S#. [id number]) was introduced through the mouth, and                     advanced to the second part of duodenum. The upper GI                     endoscopy was accomplished without difficulty. The patient                     tolerated the procedure well. Findings:      A benign-appearing, intrinsic mild stenosis was found at the  gastroesophageal junction.  The scope was withdrawn. Dilation was       performed with a Maloney dilator with mild resistance at 54 Fr.      Mild portal hypertensive gastropathy was found in the gastric body.      The exam was otherwise without abnormality.      The examined duodenum was normal.      The exam of the esophagus was otherwise normal. Impression:        - Benign-appearing esophageal stricture. Dilated.                    - Portal hypertensive gastropathy.                    - The examination was otherwise normal.                    - Normal examined duodenum.                    - No specimens collected. Recommendation:    - Discharge patient to home.                    - Observe patient's clinical course.                    - Continue present medications.                    - The findings and recommendations were discussed with the                     patient. Procedure Code(s): --- Professional ---                    [phone], Esophagogastroduodenoscopy, flexible, transoral;                     diagnostic, including collection of specimen(s) by                     brushing or washing, when performed (separate procedure)                    43450, Dilation of esophagus, by unguided sound or bougie,                     single or multiple passes Diagnosis Code(s): --- Professional ---                    K22.2, Esophageal obstruction                    K76.6, Portal hypertension                    K31.89, Other diseases of stomach and duodenum CPT copyright 2014 American Medical Association. All rights reserved. The codes documented in this report are preliminary and upon coder review may  be revised to meet current compliance requirements. Wallace Cullens, MD 02/06/2015 2:22:37 PM This report has been signed electronically. Number of Addenda: 0 Note Initiated On: 02/06/2015 2:12 PM      [facility]

## 2015-02-06 NOTE — H&P (Signed)
Primary Care Physician:  Rozanna Box, MD Primary Gastroenterologist:  Dr. Bluford Kaufmann  Pre-Procedure History & Physical: HPI:  Mark Harman Sr. is a 81 y.o. male is here for an EGD.   Past Medical History  Diagnosis Date  . Hypertension   . Heart murmur   . Atrial fibrillation 11/27/14  . Dementia   . Dysrhythmia   . Liver cirrhosis     Past Surgical History  Procedure Laterality Date  . Appendectomy    . Total hip arthroplasty  2012    right  . Joint replacement      Prior to Admission medications   Medication Sig Start Date End Date Taking? Authorizing Provider  buPROPion (ZYBAN) 150 MG 12 hr tablet Take 150 mg by mouth 2 (two) times daily.   Yes Historical Provider, MD  allopurinol (ZYLOPRIM) 100 MG tablet Take 100 mg by mouth daily.      Historical Provider, MD  digoxin (LANOXIN) 0.25 MG tablet TAKE ONE-HALF TO ONE TABLET BY MOUTH ONCE DAILY 01/03/15   Antonieta Iba, MD  folic acid (FOLVITE) 400 MCG tablet Take 400 mcg by mouth daily.    Historical Provider, MD  furosemide (LASIX) 20 MG tablet Take 1 tablet (20 mg total) by mouth daily. Patient taking differently: Take 40 mg by mouth daily.  10/25/14 10/25/15  Darien Ramus, MD  lisinopril (PRINIVIL,ZESTRIL) 20 MG tablet Take 20 mg by mouth daily.    Historical Provider, MD  metoprolol (TOPROL-XL) 50 MG 24 hr tablet Take 50 mg by mouth daily.      Historical Provider, MD  omeprazole (PRILOSEC) 20 MG capsule Take 20 mg by mouth daily.    Historical Provider, MD  Rivaroxaban (XARELTO) 20 MG TABS Take 1 tablet (20 mg total) by mouth daily. 10/06/12   Antonieta Iba, MD  spironolactone (ALDACTONE) 25 MG tablet Take 25 mg by mouth daily.    Historical Provider, MD  tamsulosin (FLOMAX) 0.4 MG CAPS capsule Take 0.4 mg by mouth daily.    Historical Provider, MD    Allergies as of 02/04/2015  . (No Known Allergies)    Family History  Problem Relation Age of Onset  . Hypertension Mother   . Diabetes Mother   .  Alzheimer's disease Mother     Social History   Social History  . Marital Status: Married    Spouse Name: N/A  . Number of Children: N/A  . Years of Education: N/A   Occupational History  . Not on file.   Social History Main Topics  . Smoking status: Never Smoker   . Smokeless tobacco: Never Used  . Alcohol Use: 8.4 oz/week    14 Glasses of wine per week     Comment: occasional  . Drug Use: No  . Sexual Activity: Not on file   Other Topics Concern  . Not on file   Social History Narrative    Review of Systems: See HPI, otherwise negative ROS  Physical Exam: There were no vitals taken for this visit. General:   Alert,  pleasant and cooperative in NAD Head:  Normocephalic and atraumatic. Neck:  Supple; no masses or thyromegaly. Lungs:  Clear throughout to auscultation.    Heart:  Regular rate and rhythm. Abdomen:  Soft, nontender and nondistended. Normal bowel sounds, without guarding, and without rebound.   Neurologic:  Alert and  oriented x4;  grossly normal neurologically.  Impression/Plan: Mark Harman Sr. is here for an EGD to  be performed for liver cirrhosis.  Risks, benefits, limitations, and alternatives regarding  EGD have been reviewed with the patient.  Questions have been answered.  All parties agreeable.   Adaia Matthies, Ezzard Standing, MD  02/06/2015, 1:14 PM

## 2015-02-06 NOTE — Anesthesia Postprocedure Evaluation (Signed)
  Anesthesia Post-op Note  Patient: Mark MEXICANO Sr.  Procedure(s) Performed: Procedure(s): ESOPHAGOGASTRODUODENOSCOPY (EGD) WITH PROPOFOL (N/A)  Anesthesia type:General  Patient location: PACU  Post pain: Pain level controlled  Post assessment: Post-op Vital signs reviewed, Patient's Cardiovascular Status Stable, Respiratory Function Stable, Patent Airway and No signs of Nausea or vomiting  Post vital signs: Reviewed and stable  Last Vitals:  Filed Vitals:   02/06/15 1500  BP: 113/52  Pulse: 57  Temp:   Resp: 18    Level of consciousness: awake, alert  and patient cooperative  Complications: No apparent anesthesia complications

## 2015-02-07 ENCOUNTER — Ambulatory Visit
Admission: RE | Admit: 2015-02-07 | Discharge: 2015-02-07 | Disposition: A | Discharge status: Home / Self Care | Payer: Medicare Other | Source: Ambulatory Visit | Attending: Gastroenterology | Admitting: Gastroenterology

## 2015-02-07 ENCOUNTER — Ambulatory Visit
Admission: RE | Admit: 2015-02-07 | Discharge: 2015-02-07 | Disposition: A | Discharge status: Home / Self Care | Payer: Medicare Other | Source: Ambulatory Visit | Attending: Nurse Practitioner | Admitting: Nurse Practitioner

## 2015-02-07 DIAGNOSIS — R188 Other ascites: Secondary | ICD-10-CM | POA: Insufficient documentation

## 2015-02-07 DIAGNOSIS — K7031 Alcoholic cirrhosis of liver with ascites: Secondary | ICD-10-CM

## 2015-02-07 LAB — BODY FLUID CELL COUNT WITH DIFFERENTIAL
EOS FL: 0 %
Lymphs, Fluid: 29 %
MONOCYTE-MACROPHAGE-SEROUS FLUID: 10 %
Neutrophil Count, Fluid: 61 %
OTHER CELLS FL: 0 %
WBC FLUID: 604 uL

## 2015-02-07 LAB — PROTEIN, TOTAL

## 2015-02-07 LAB — LACTATE DEHYDROGENASE, PLEURAL OR PERITONEAL FLUID: LD FL: 45 U/L — AB (ref 3–23)

## 2015-02-07 LAB — PATHOLOGIST SMEAR REVIEW

## 2015-02-07 LAB — GLUCOSE, SEROUS FLUID: GLUCOSE FL: 104 mg/dL

## 2015-02-07 LAB — ALBUMIN, FLUID (OTHER)

## 2015-02-07 NOTE — Procedures (Signed)
US Paracentesis  Complications:  None  Blood Loss: none  See dictation in canopy pacs  

## 2015-03-10 ENCOUNTER — Inpatient Hospital Stay (HOSPITAL_COMMUNITY): Payer: Medicare Other

## 2015-03-10 ENCOUNTER — Emergency Department (HOSPITAL_COMMUNITY): Payer: Medicare Other

## 2015-03-10 ENCOUNTER — Inpatient Hospital Stay (HOSPITAL_COMMUNITY)
Admission: EM | Admit: 2015-03-10 | Discharge: 2015-03-14 | DRG: 441 | Disposition: A | Discharge status: Home Health | Payer: Medicare Other | Attending: Internal Medicine | Admitting: Internal Medicine

## 2015-03-10 DIAGNOSIS — E861 Hypovolemia: Secondary | ICD-10-CM | POA: Diagnosis not present

## 2015-03-10 DIAGNOSIS — K7290 Hepatic failure, unspecified without coma: Secondary | ICD-10-CM | POA: Diagnosis present

## 2015-03-10 DIAGNOSIS — R188 Other ascites: Secondary | ICD-10-CM

## 2015-03-10 DIAGNOSIS — I10 Essential (primary) hypertension: Secondary | ICD-10-CM | POA: Diagnosis not present

## 2015-03-10 DIAGNOSIS — E43 Unspecified severe protein-calorie malnutrition: Secondary | ICD-10-CM | POA: Diagnosis present

## 2015-03-10 DIAGNOSIS — R64 Cachexia: Secondary | ICD-10-CM | POA: Diagnosis not present

## 2015-03-10 DIAGNOSIS — Z7901 Long term (current) use of anticoagulants: Secondary | ICD-10-CM | POA: Diagnosis not present

## 2015-03-10 DIAGNOSIS — N400 Enlarged prostate without lower urinary tract symptoms: Secondary | ICD-10-CM | POA: Diagnosis present

## 2015-03-10 DIAGNOSIS — E8729 Other acidosis: Secondary | ICD-10-CM | POA: Diagnosis present

## 2015-03-10 DIAGNOSIS — D649 Anemia, unspecified: Secondary | ICD-10-CM | POA: Diagnosis present

## 2015-03-10 DIAGNOSIS — K7460 Unspecified cirrhosis of liver: Secondary | ICD-10-CM

## 2015-03-10 DIAGNOSIS — I4891 Unspecified atrial fibrillation: Secondary | ICD-10-CM | POA: Diagnosis present

## 2015-03-10 DIAGNOSIS — E872 Acidosis, unspecified: Secondary | ICD-10-CM | POA: Diagnosis present

## 2015-03-10 DIAGNOSIS — R1310 Dysphagia, unspecified: Secondary | ICD-10-CM | POA: Diagnosis not present

## 2015-03-10 DIAGNOSIS — I959 Hypotension, unspecified: Secondary | ICD-10-CM | POA: Diagnosis not present

## 2015-03-10 DIAGNOSIS — R443 Hallucinations, unspecified: Secondary | ICD-10-CM | POA: Diagnosis not present

## 2015-03-10 DIAGNOSIS — Z96649 Presence of unspecified artificial hip joint: Secondary | ICD-10-CM | POA: Diagnosis not present

## 2015-03-10 DIAGNOSIS — K921 Melena: Secondary | ICD-10-CM | POA: Diagnosis not present

## 2015-03-10 DIAGNOSIS — K7031 Alcoholic cirrhosis of liver with ascites: Secondary | ICD-10-CM | POA: Diagnosis present

## 2015-03-10 DIAGNOSIS — G9340 Encephalopathy, unspecified: Secondary | ICD-10-CM

## 2015-03-10 DIAGNOSIS — K5900 Constipation, unspecified: Secondary | ICD-10-CM | POA: Diagnosis present

## 2015-03-10 DIAGNOSIS — E785 Hyperlipidemia, unspecified: Secondary | ICD-10-CM | POA: Diagnosis not present

## 2015-03-10 DIAGNOSIS — E876 Hypokalemia: Secondary | ICD-10-CM | POA: Diagnosis not present

## 2015-03-10 DIAGNOSIS — I110 Hypertensive heart disease with heart failure: Secondary | ICD-10-CM | POA: Diagnosis present

## 2015-03-10 DIAGNOSIS — Z6820 Body mass index (BMI) 20.0-20.9, adult: Secondary | ICD-10-CM | POA: Diagnosis not present

## 2015-03-10 DIAGNOSIS — F0280 Dementia in other diseases classified elsewhere without behavioral disturbance: Secondary | ICD-10-CM | POA: Diagnosis present

## 2015-03-10 DIAGNOSIS — R4182 Altered mental status, unspecified: Secondary | ICD-10-CM | POA: Diagnosis present

## 2015-03-10 HISTORY — DX: Other ascites: R18.8

## 2015-03-10 HISTORY — DX: Unspecified cirrhosis of liver: K74.60

## 2015-03-10 LAB — CBC WITH DIFFERENTIAL/PLATELET
BASOS ABS: 0.1 10*3/uL (ref 0.0–0.1)
BASOS PCT: 0 %
Eosinophils Absolute: 0 10*3/uL (ref 0.0–0.7)
Eosinophils Relative: 0 %
HEMATOCRIT: 30.7 % — AB (ref 39.0–52.0)
HEMOGLOBIN: 10.8 g/dL — AB (ref 13.0–17.0)
Lymphocytes Relative: 13 %
Lymphs Abs: 1.6 10*3/uL (ref 0.7–4.0)
MCH: 34.6 pg — AB (ref 26.0–34.0)
MCHC: 35.2 g/dL (ref 30.0–36.0)
MCV: 98.4 fL (ref 78.0–100.0)
MONOS PCT: 13 %
Monocytes Absolute: 1.6 10*3/uL — ABNORMAL HIGH (ref 0.1–1.0)
NEUTROS ABS: 9.3 10*3/uL — AB (ref 1.7–7.7)
NEUTROS PCT: 74 %
PLATELETS: 259 10*3/uL (ref 150–400)
RBC: 3.12 MIL/uL — AB (ref 4.22–5.81)
RDW: 15.2 % (ref 11.5–15.5)
WBC: 12.5 10*3/uL — ABNORMAL HIGH (ref 4.0–10.5)

## 2015-03-10 LAB — URINE MICROSCOPIC-ADD ON

## 2015-03-10 LAB — URINALYSIS, ROUTINE W REFLEX MICROSCOPIC
Glucose, UA: NEGATIVE mg/dL
HGB URINE DIPSTICK: NEGATIVE
KETONES UR: NEGATIVE mg/dL
NITRITE: NEGATIVE
Protein, ur: NEGATIVE mg/dL
SPECIFIC GRAVITY, URINE: 1.016 (ref 1.005–1.030)
UROBILINOGEN UA: 1 mg/dL (ref 0.0–1.0)
pH: 5.5 (ref 5.0–8.0)

## 2015-03-10 LAB — COMPREHENSIVE METABOLIC PANEL
ALK PHOS: 87 U/L (ref 38–126)
ALT: 18 U/L (ref 17–63)
ANION GAP: 17 — AB (ref 5–15)
AST: 52 U/L — ABNORMAL HIGH (ref 15–41)
Albumin: 2.3 g/dL — ABNORMAL LOW (ref 3.5–5.0)
BUN: 7 mg/dL (ref 6–20)
CALCIUM: 8.4 mg/dL — AB (ref 8.9–10.3)
CHLORIDE: 94 mmol/L — AB (ref 101–111)
CO2: 19 mmol/L — AB (ref 22–32)
Creatinine, Ser: 0.75 mg/dL (ref 0.61–1.24)
GFR calc non Af Amer: >60 mL/min (ref >60)
Glucose, Bld: 119 mg/dL — ABNORMAL HIGH (ref 65–99)
Potassium: 3.4 mmol/L — ABNORMAL LOW (ref 3.5–5.1)
SODIUM: 130 mmol/L — AB (ref 135–145)
Total Bilirubin: 3.4 mg/dL — ABNORMAL HIGH (ref 0.3–1.2)
Total Protein: 6.1 g/dL — ABNORMAL LOW (ref 6.5–8.1)

## 2015-03-10 LAB — PROTIME-INR
INR: 1.67 — ABNORMAL HIGH (ref 0.00–1.49)
PROTHROMBIN TIME: 19.7 s — AB (ref 11.6–15.2)

## 2015-03-10 LAB — AMMONIA: AMMONIA: 55 umol/L — AB (ref 9–35)

## 2015-03-10 LAB — I-STAT CG4 LACTIC ACID, ED: Lactic Acid, Venous: 4.86 mmol/L (ref 0.5–2.0)

## 2015-03-10 LAB — ETHANOL

## 2015-03-10 MED ORDER — ALLOPURINOL 100 MG PO TABS
100.0000 mg | ORAL_TABLET | Freq: Every day | ORAL | Status: DC
  Administered 2015-03-12 – 2015-03-14 (×3): 100 mg via ORAL
  Filled 2015-03-10 (×3): qty 1

## 2015-03-10 MED ORDER — LACTULOSE ENEMA
300.0000 mL | Freq: Two times a day (BID) | ORAL | Status: DC
  Administered 2015-03-11: 300 mL via RECTAL
  Filled 2015-03-10 (×4): qty 300

## 2015-03-10 MED ORDER — SPIRONOLACTONE 25 MG PO TABS: 25.0000 mg | ORAL_TABLET | Freq: Every day | ORAL | Status: DC

## 2015-03-10 MED ORDER — METOPROLOL SUCCINATE ER 50 MG PO TB24
50.0000 mg | ORAL_TABLET | Freq: Every day | ORAL | Status: DC
  Administered 2015-03-12: 50 mg via ORAL
  Filled 2015-03-10: qty 1

## 2015-03-10 MED ORDER — PANTOPRAZOLE SODIUM 40 MG PO TBEC: 40.0000 mg | DELAYED_RELEASE_TABLET | Freq: Every day | ORAL | Status: DC

## 2015-03-10 MED ORDER — FUROSEMIDE 40 MG PO TABS: 40.0000 mg | ORAL_TABLET | Freq: Every day | ORAL | Status: DC

## 2015-03-10 MED ORDER — SODIUM CHLORIDE 0.9 % IV SOLN
INTRAVENOUS | Status: AC
  Administered 2015-03-11: 03:00:00 via INTRAVENOUS

## 2015-03-10 MED ORDER — ENOXAPARIN SODIUM 40 MG/0.4ML ~~LOC~~ SOLN: 40.0000 mg | SUBCUTANEOUS | Status: DC

## 2015-03-10 MED ORDER — LACTULOSE 10 GM/15ML PO SOLN
30.0000 g | Freq: Once | ORAL | Status: DC
  Filled 2015-03-10: qty 45

## 2015-03-10 MED ORDER — SPIRONOLACTONE 25 MG PO TABS: 50.0000 mg | ORAL_TABLET | Freq: Every day | ORAL | Status: DC

## 2015-03-10 MED ORDER — FOLIC ACID 0.5 MG HALF TAB
400.0000 ug | ORAL_TABLET | Freq: Every day | ORAL | Status: DC
  Administered 2015-03-12 – 2015-03-14 (×3): 0.5 mg via ORAL
  Filled 2015-03-10 (×5): qty 1

## 2015-03-10 MED ORDER — LISINOPRIL 20 MG PO TABS
20.0000 mg | ORAL_TABLET | Freq: Every day | ORAL | Status: DC
  Administered 2015-03-12: 20 mg via ORAL
  Filled 2015-03-10: qty 1

## 2015-03-10 MED ORDER — BUPROPION HCL ER (SMOKING DET) 150 MG PO TB12
150.0000 mg | ORAL_TABLET | Freq: Two times a day (BID) | ORAL | Status: DC
  Administered 2015-03-11 – 2015-03-14 (×5): 150 mg via ORAL
  Filled 2015-03-10 (×10): qty 1

## 2015-03-10 MED ORDER — TAMSULOSIN HCL 0.4 MG PO CAPS
0.4000 mg | ORAL_CAPSULE | Freq: Every day | ORAL | Status: DC
  Administered 2015-03-12 – 2015-03-14 (×3): 0.4 mg via ORAL
  Filled 2015-03-10 (×3): qty 1

## 2015-03-10 MED ORDER — RIVAROXABAN 20 MG PO TABS: 20.0000 mg | ORAL_TABLET | Freq: Every day | ORAL | Status: DC

## 2015-03-10 NOTE — ED Notes (Signed)
Pt taken to CT.

## 2015-03-10 NOTE — ED Notes (Signed)
Pt arrives from home via [facility]. Pt family called and stated pt had an increased altered mental status and CP. Pt has a hx of dementia and afib. Pt is in afib currently and endorses bilateral hand pain and nothing else. Pt has a hx of cirrhosis and ems reports jaundice, swollen abdomen and +4 pitting edema.

## 2015-03-10 NOTE — ED Notes (Signed)
Pt to ED with family c/o worsening mental status. Pt noted to have edema, worse to LUE and LLE. Pt alert to self only, baseline for patient, hx of dementia. Pt reports bilateral hand pain, both hands cold to touch, cap refill <3

## 2015-03-10 NOTE — ED Provider Notes (Signed)
CSN: [id number]     Arrival date & time 03/10/15  1901 History   First MD Initiated Contact with Patient 03/10/15 1903     Chief Complaint  Patient presents with  . Altered Mental Status     (Consider location/radiation/quality/duration/timing/severity/associated sxs/prior Treatment) HPI Patient presents with his wife who provides the history of present illness. Level V caveat secondary to dementia and altered mental status. Patient has a history of cirrhosis, dementia, but wife notes that over the past 2 days he has had decreased interactivity, increased confusion and increased listlessness. Patient himself denies pain, offers brief, occasionally appropriate responses.  Past Medical History  Diagnosis Date  . Hypertension   . Heart murmur   . Atrial fibrillation 11/27/14  . Dementia   . Dysrhythmia   . Liver cirrhosis    Past Surgical History  Procedure Laterality Date  . Appendectomy    . Total hip arthroplasty  2012    right  . Joint replacement    . Esophagogastroduodenoscopy (egd) with propofol N/A 02/06/2015    Procedure: ESOPHAGOGASTRODUODENOSCOPY (EGD) WITH PROPOFOL;  Surgeon: Wallace Cullens, MD;  Location: [facility] ENDOSCOPY;  Service: Gastroenterology;  Laterality: N/A;   Family History  Problem Relation Age of Onset  . Hypertension Mother   . Diabetes Mother   . Alzheimer's disease Mother    Social History  Substance Use Topics  . Smoking status: Never Smoker   . Smokeless tobacco: Never Used  . Alcohol Use: 8.4 oz/week    14 Glasses of wine per week     Comment: occasional    Review of Systems  Unable to perform ROS: Dementia      Allergies  Review of patient's allergies indicates no known allergies.  Home Medications   Prior to Admission medications   Medication Sig Start Date End Date Taking? Authorizing Provider  allopurinol (ZYLOPRIM) 100 MG tablet Take 100 mg by mouth daily.      Historical Provider, MD  buPROPion (ZYBAN) 150 MG 12 hr tablet Take  150 mg by mouth 2 (two) times daily.    Historical Provider, MD  digoxin (LANOXIN) 0.25 MG tablet TAKE ONE-HALF TO ONE TABLET BY MOUTH ONCE DAILY 01/03/15   Antonieta Iba, MD  folic acid (FOLVITE) 400 MCG tablet Take 400 mcg by mouth daily.    Historical Provider, MD  furosemide (LASIX) 20 MG tablet Take 1 tablet (20 mg total) by mouth daily. Patient taking differently: Take 40 mg by mouth daily.  10/25/14 10/25/15  Darien Ramus, MD  lisinopril (PRINIVIL,ZESTRIL) 20 MG tablet Take 20 mg by mouth daily.    Historical Provider, MD  metoprolol (TOPROL-XL) 50 MG 24 hr tablet Take 50 mg by mouth daily.      Historical Provider, MD  omeprazole (PRILOSEC) 20 MG capsule Take 20 mg by mouth daily.    Historical Provider, MD  Rivaroxaban (XARELTO) 20 MG TABS Take 1 tablet (20 mg total) by mouth daily. 10/06/12   Antonieta Iba, MD  spironolactone (ALDACTONE) 25 MG tablet Take 25 mg by mouth daily.    Historical Provider, MD  tamsulosin (FLOMAX) 0.4 MG CAPS capsule Take 0.4 mg by mouth daily.    Historical Provider, MD   BP 157/83 mmHg  Pulse 105  Temp(Src) 97.8 F (36.6 C) (Oral)  Resp 16  SpO2 100% Physical Exam  Constitutional: He appears cachectic. He appears ill. No distress.  HENT:  Head: Normocephalic and atraumatic.  Eyes: Conjunctivae and EOM are normal. Scleral icterus  is present.  Cardiovascular: Normal rate and regular rhythm.   Pulmonary/Chest: Effort normal. No stridor. No respiratory distress.  Abdominal: He exhibits no distension.  Musculoskeletal: He exhibits no edema.  Neurological:  Moves all extremities spontaneously, generally appropriate with commands  Skin: Skin is warm and dry.  Psychiatric: He is slowed and withdrawn. Cognition and memory are impaired.  Nursing note and vitals reviewed.   ED Course  Procedures (including critical care time) Labs Review Labs Reviewed  COMPREHENSIVE METABOLIC PANEL - Abnormal; Notable for the following:    Sodium 130 (*)     Potassium 3.4 (*)    Chloride 94 (*)    CO2 19 (*)    Glucose, Bld 119 (*)    Calcium 8.4 (*)    Total Protein 6.1 (*)    Albumin 2.3 (*)    AST 52 (*)    Total Bilirubin 3.4 (*)    Anion gap 17 (*)    All other components within normal limits  CBC WITH DIFFERENTIAL/PLATELET - Abnormal; Notable for the following:    WBC 12.5 (*)    RBC 3.12 (*)    Hemoglobin 10.8 (*)    HCT 30.7 (*)    MCH 34.6 (*)    Neutro Abs 9.3 (*)    Monocytes Absolute 1.6 (*)    All other components within normal limits  PROTIME-INR - Abnormal; Notable for the following:    Prothrombin Time 19.7 (*)    INR 1.67 (*)    All other components within normal limits  AMMONIA - Abnormal; Notable for the following:    Ammonia 55 (*)    All other components within normal limits  I-STAT CG4 LACTIC ACID, ED - Abnormal; Notable for the following:    Lactic Acid, Venous 4.86 (*)    All other components within normal limits  ETHANOL  URINALYSIS, ROUTINE W REFLEX MICROSCOPIC (NOT AT [facility])    Imaging Review Ct Head Wo Contrast  03/10/2015   CLINICAL DATA:  Altered mental status.  EXAM: CT HEAD WITHOUT CONTRAST  TECHNIQUE: Contiguous axial images were obtained from the base of the skull through the vertex without intravenous contrast.  COMPARISON:  02/03/2015 head CT.  FINDINGS: No evidence of parenchymal hemorrhage or extra-axial fluid collection. There are stable mild calcifications in the bilateral globus pallidus. No mass lesion, mass effect, or midline shift.  No CT evidence of acute infarction. There is intracranial atherosclerotic arterial calcification. Nonspecific stable mild-to-moderate subcortical and periventricular white matter hypodensity, most in keeping with chronic small vessel ischemic change. There is stable diffuse mild cerebral volume loss. No ventriculomegaly.  The visualized paranasal sinuses are essentially clear. The mastoid air cells are unopacified. No evidence of calvarial fracture.  IMPRESSION:  1. No acute intracranial abnormality. 2. Intracranial atherosclerosis, mild diffuse cerebral volume loss and nonspecific chronic small vessel ischemic white matter change, stable.   Electronically Signed   By: Delbert Phenix M.D.   On: 03/10/2015 20:13   Dg Chest Port 1 View  03/10/2015   CLINICAL DATA:  History per patient's family with reported altered mental status and chest pain. History of dementia and atrial fibrillation.  EXAM: PORTABLE CHEST 1 VIEW  COMPARISON:  02/03/2015  FINDINGS: Cardiac silhouette is mildly enlarged. No mediastinal or hilar masses or evidence of adenopathy. Mild basilar opacity is noted similar to the prior study, likely atelectasis. No convincing pneumonia and no pulmonary edema. No pneumothorax.  Bony thorax is intact.  IMPRESSION: No acute cardiopulmonary disease.  Electronically Signed   By: Amie Portland M.D.   On: 03/10/2015 20:20   I have personally reviewed and evaluated these images and lab results as part of my medical decision-making.   EKG Interpretation   Date/Time:  Monday March 10 2015 19:05:02 EDT Ventricular Rate:  116 PR Interval:    QRS Duration: 62 QT Interval:  328 QTC Calculation: 455 R Axis:   12 Text Interpretation:  Atrial fibrillation with rapid ventricular response  with premature ventricular or aberrantly conducted complexes Low voltage  QRS Cannot rule out Anterior infarct , age undetermined Abnormal ECG  Atrial fibrillation with rapid ventricular response Abnormal ekg Confirmed  by Gerhard Munch  MD [id number]) on 03/10/2015 7:31:35 PM     Chart review demonstrates a history of cirrhosis, dementia.  One month ago he was at [employer] w hallucinations.  Ammonia was nml.  Initial labs notable for hyperammonemia, lactic acidosis, leukocytosis.  On repeat exam the patient remains in similar condition, grossly encephalopathic. I discussed all findings with the patient's wife, and sister-in-law. They concur that hyperammonemia is a  new, and that during a recent evaluation at [facility], was unremarkable. Patient will be started on lactulose. MDM   Final diagnoses:  Encephalopathy   Patient presents grossly encephalopathic. Patient is in the cirrhotic, and today, is awake, but altered, but in no distress, with no cough, fever, family report of dysuria or Patient's labs are consistent with progression of disease, and with soft, nontender abdomen, peritonitis is unlikely. Patient was started on lactulose, admitted for further evaluation, management.   Gerhard Munch, MD 03/10/15 [phone]

## 2015-03-10 NOTE — ED Notes (Signed)
Admitting MDs at bedside.

## 2015-03-10 NOTE — H&P (Signed)
Date: 03/10/2015               Patient Name:  Mark Stephens. MRN: [id number]  DOB: 21-Mar-1934 Age / Sex: 81 y.o., male   PCP: Kandyce Rud, MD         Medical Service: Internal Medicine Teaching Service         Attending Physician: Dr. Inez Catalina, MD    First Contact: Dr. Deneise Lever Pager: [phone]  Second Contact: Dr. Jill Alexanders Pager: [phone]       After Hours (After 5p/  First Contact Pager: [phone]  weekends / holidays): Second Contact Pager: [phone]   Chief Complaint: Confusion, per wife  History of Present Illness: [name] is a 81 year old man with history of alcoholic cirrhosis, atrial fibrillation, and hypertension presenting with progressive confusion and difficulty swallowing over the last two months. His wife provided the history as the patient was oriented only to person, confabulating, and clearly confused. Over the last two months, he has been more confused, sometimes hallucinating and seeing "markers" on the wall. During this time, his abdomen has gotten more distended since a paracentesis one month ago when they pulled off 3 liters of fluid, and his legs have been swelling quite a bite. He's also been constipated, and he has never tried taking lactulose or rifaximin. The wife also reports that his stools have been "jet black," which she attributed to all of the grape juice he drinks, which he thinks is wine (the patient quit drinking alcohol one year ago). He is followed by gastroenterology and underwent endoscopy one month ago that showed hypertensive gastropathy but no varices. His hemoglobin was 13.5 back in June but was 10.6 this admission. Regarding his lower legs welling, the wife says he sleeps flat on his back with a problem and is not short of breath. She says he had an echocardiogram back in 2011 but has not had one since then. He is currently taking digoxin, lisinopril, metoprolol, spironolactone, and furosemide, and has been compliant with  these medications. He has been complaining of "numb hands" but denies any changes in his vision, nausea, vomiting, or diarrhea.  In the emergency department, his vital signs were normal. Labs were notable for hyponatremia of 130, metabolic acidosis with a bicarb of 19 and anion gap of 17, AST 52 and ALT 18, total bilirubin of 3.4, ammonia 55, hemoglobin 10.8, platelet 259, INR 1.67, normal urinalysis, unremarkable chest x-ray, unremarkable CT head, and EKG showing atrial fibrillation without ischemic changes.   Meds: Current Facility-Administered Medications  Medication Dose Route Frequency Provider Last Rate Last Dose  . buPROPion (ZYBAN) 12 hr tablet 150 mg  150 mg Oral BID Tasrif Ahmed, MD      . folic acid (FOLVITE) tablet 400 mcg  400 mcg Oral Daily Tasrif Ahmed, MD      . furosemide (LASIX) tablet 40 mg  40 mg Oral Daily Tasrif Ahmed, MD      . lactulose (CHRONULAC) 10 GM/15ML solution 30 g  30 g Oral Once Gerhard Munch, MD      . lisinopril (PRINIVIL,ZESTRIL) tablet 20 mg  20 mg Oral Daily Tasrif Ahmed, MD      . metoprolol succinate (TOPROL-XL) 24 hr tablet 50 mg  50 mg Oral Daily Tasrif Ahmed, MD      . pantoprazole (PROTONIX) EC tablet 40 mg  40 mg Oral Daily Tasrif Ahmed, MD      . rivaroxaban (XARELTO) tablet 20 mg  20 mg Oral Daily Tasrif Ahmed, MD      . spironolactone (ALDACTONE) tablet 25 mg  25 mg Oral Daily Tasrif Ahmed, MD      . tamsulosin (FLOMAX) capsule 0.4 mg  0.4 mg Oral Daily Tasrif Ahmed, MD       Current Outpatient Prescriptions  Medication Sig Dispense Refill  . allopurinol (ZYLOPRIM) 100 MG tablet Take 100 mg by mouth daily.      . digoxin (LANOXIN) 0.25 MG tablet TAKE ONE-HALF TO ONE TABLET BY MOUTH ONCE DAILY 90 tablet 0  . folic acid (FOLVITE) 400 MCG tablet Take 400 mcg by mouth daily.    . furosemide (LASIX) 20 MG tablet Take 40 mg by mouth daily.    Marland Kitchen lisinopril (PRINIVIL,ZESTRIL) 20 MG tablet Take 20 mg by mouth daily.    . metoprolol (TOPROL-XL) 50 MG  24 hr tablet Take 50 mg by mouth daily.      . Rivaroxaban (XARELTO) 20 MG TABS Take 1 tablet (20 mg total) by mouth daily. 90 tablet 3  . spironolactone (ALDACTONE) 25 MG tablet Take 25 mg by mouth daily.    . tamsulosin (FLOMAX) 0.4 MG CAPS capsule Take 0.4 mg by mouth daily.      Allergies: Allergies as of 03/10/2015  . (No Known Allergies)   Past Medical History  Diagnosis Date  . Hypertension   . Heart murmur   . Atrial fibrillation 11/27/14  . Dementia   . Dysrhythmia   . Liver cirrhosis    Past Surgical History  Procedure Laterality Date  . Appendectomy    . Total hip arthroplasty  2012    right  . Joint replacement    . Esophagogastroduodenoscopy (egd) with propofol N/A 02/06/2015    Procedure: ESOPHAGOGASTRODUODENOSCOPY (EGD) WITH PROPOFOL;  Surgeon: Wallace Cullens, MD;  Location: [facility] ENDOSCOPY;  Service: Gastroenterology;  Laterality: N/A;   Family History  Problem Relation Age of Onset  . Hypertension Mother   . Diabetes Mother   . Alzheimer's disease Mother    Social History   Social History  . Marital Status: Married    Spouse Name: N/A  . Number of Children: N/A  . Years of Education: N/A   Occupational History  . Not on file.   Social History Main Topics  . Smoking status: Never Smoker   . Smokeless tobacco: Never Used  . Alcohol Use: 8.4 oz/week    14 Glasses of wine per week     Comment: occasional  . Drug Use: No  . Sexual Activity: Not on file   Other Topics Concern  . Not on file   Social History Narrative    Review of Systems  Unable to perform ROS: mental status change   Physical Exam: Blood pressure 119/61, pulse 105, temperature 98.4 F (36.9 C), temperature source Oral, resp. rate 18, SpO2 100 %.  General: cachectic elderly man lying in bed HEENT: subtle scleral icterus Cardiac: irregular, no rubs, murmurs or gallops Pulm: breathing well, clear to auscultation bilaterally Abd: distended, with positive fluid shift and dull to  percussion over flanks, but non-tender to palpation Ext: warm and well perfused, with 4+ pitting edema on lower extremities and 2+ pitting edema on upper extremities Lymph: no cervical LAD Skin: Terry's nails, no telangiectasia Neuro: alert and oriented X1, no asterixis, cranial nerves II-XII grossly intact  Lab results: Basic Metabolic Panel:  Recent Labs  16/10/96 1954  NA 130*  K 3.4*  CL 94*  CO2 19*  GLUCOSE 119*  BUN 7  CREATININE 0.75  CALCIUM 8.4*   Liver Function Tests:  Recent Labs  03/10/15 1954  AST 52*  ALT 18  ALKPHOS 87  BILITOT 3.4*  PROT 6.1*  ALBUMIN 2.3*    Recent Labs  03/10/15 1953  AMMONIA 55*   CBC:  Recent Labs  03/10/15 1954  WBC 12.5*  NEUTROABS 9.3*  HGB 10.8*  HCT 30.7*  MCV 98.4  PLT 259   Coagulation:  Recent Labs  03/10/15 1954  LABPROT 19.7*  INR 1.67*   Alcohol Level:  Recent Labs  03/10/15 1953  ETH <5   Urinalysis:  Recent Labs  03/10/15 2139  COLORURINE AMBER*  LABSPEC 1.016  PHURINE 5.5  GLUCOSEU NEGATIVE  HGBUR NEGATIVE  BILIRUBINUR SMALL*  KETONESUR NEGATIVE  PROTEINUR NEGATIVE  UROBILINOGEN 1.0  NITRITE NEGATIVE  LEUKOCYTESUR SMALL*   Imaging results:  Ct Head Wo Contrast  03/10/2015   CLINICAL DATA:  Altered mental status.  EXAM: CT HEAD WITHOUT CONTRAST  TECHNIQUE: Contiguous axial images were obtained from the base of the skull through the vertex without intravenous contrast.  COMPARISON:  02/03/2015 head CT.  FINDINGS: No evidence of parenchymal hemorrhage or extra-axial fluid collection. There are stable mild calcifications in the bilateral globus pallidus. No mass lesion, mass effect, or midline shift.  No CT evidence of acute infarction. There is intracranial atherosclerotic arterial calcification. Nonspecific stable mild-to-moderate subcortical and periventricular white matter hypodensity, most in keeping with chronic small vessel ischemic change. There is stable diffuse mild cerebral  volume loss. No ventriculomegaly.  The visualized paranasal sinuses are essentially clear. The mastoid air cells are unopacified. No evidence of calvarial fracture.  IMPRESSION: 1. No acute intracranial abnormality. 2. Intracranial atherosclerosis, mild diffuse cerebral volume loss and nonspecific chronic small vessel ischemic white matter change, stable.   Electronically Signed   By: Delbert Phenix M.D.   On: 03/10/2015 20:13   Dg Chest Port 1 View  03/10/2015   CLINICAL DATA:  History per patient's family with reported altered mental status and chest pain. History of dementia and atrial fibrillation.  EXAM: PORTABLE CHEST 1 VIEW  COMPARISON:  02/03/2015  FINDINGS: Cardiac silhouette is mildly enlarged. No mediastinal or hilar masses or evidence of adenopathy. Mild basilar opacity is noted similar to the prior study, likely atelectasis. No convincing pneumonia and no pulmonary edema. No pneumothorax.  Bony thorax is intact.  IMPRESSION: No acute cardiopulmonary disease.   Electronically Signed   By: Amie Portland M.D.   On: 03/10/2015 20:20    Other results: EKG: unchanged from previous tracings, atrial fibrillation, rate 116.  Assessment & Plan by Problem: Mr. Zepeda is a 81 year old man with history of alcoholic cirrhosis with a MELD score of 22 (6% mortality in the next 3 months) presenting with progressive confusion over the last month in the setting of worsening abdominal ascites, constipation, and questionable melena. I believe he has hepatic encephalopathy, although he does not have asterixis on exam, and he has several possible reasons for having this, including constipation, worsening cirrhosis, and possibly a gastrointestinal bleed. His confusion may also be from digoxin toxicity, given his odd complaint of hand numbness, but he denies other symptoms such as nausea, vomiting, or vision changes. I do not think he has spontaneous bacterial peritonitis, as he denies abdominal pain, is afebrile,  however he does have a lactic acidosis of 4.8 which I can't fully explain besides perhaps his intrinsic liver disease or intravascular hypovolemia from  his hypoalbuminemia and third-spacing into his ascites. We'll hold on fluids until we get an ejection fraction tomorrow.   Tonight, we will try lactulose, obtain a fecal occult blood sample, check a digoxin level, and consider paracentesis tomorrow pending results of a limited abdominal ultrasound to evaluate for fluid. I also think we have room to up-titrate his spironolactone as well given his potassium is slightly low today at 3.4. I think that his lower extremity edema is from the cirrhosis, and not his heart, as his lungs are clear and he denies typical symptoms of congestive heart failure. His last echocardiogram was in 2011 showed an ejection fraction of 45%. However, he is on digoxin and several other heart failure medications. We'll obtain another echocardiogram this admission to further evaluate.   Progressive confusion: Per above, differential includes hepatic encephalopathy, digoxin toxicity, and spontaneous bacterial peritonitis. -Start lactulose suppository tonight; this should be titrated to 2-3 bowel movements per day -Increase spironolactone from 25 to 50mg  daily -Check digoxin level -Limited ultrasound tonight to evaluate for fluid -Possibly paracentesis tomorrow -Continue furosemide 40mg  daily  Questionable congestive heart failure: Per above, I question this diagnosis and will further evaluate with an echocardiogram. -Transthoracic echocardiogram  -Continue lisinopril 20mg  daily -Continue diuretics per above -Continue digoxin 0.125mg  -Folic acid 0.5mg  -Fecal occult -We will hold on giving fluids at this time  Metabolic acidosis with anion gap and lactic acidosis: Per above, I suspect he is intravascularly hypovolemic from hypoalbuminemia and third-spacing. -Will hold on fluids pending echocardiogram -Will re-check  lactate -BMP tomorrow  Alcoholic cirrhosis: Per above. He does not have hepatitis panels so we will order those as well. -Chronic hepatitis B and C panels  Normocytic anemia: This is new since June so I question whether his melena is truly from grape juice or from a gastrointestinal bleed. He had a normal endoscopy one month ago that did not show varices and he denies hemoptysis. -Fecal occult blood test -Anemia panel  Atrial fibrillation: Rates are stable in 80s. -Continue home xarelto -Metoprolol XL 50mg   Benign prostatic hypertrophy: Not an acute issue. -Continue tamsulosin 0.4mg  daily  Dispo: Disposition is deferred at this time, awaiting improvement of current medical problems.  The patient does have a current PCP Kandyce Rud, MD) and does need an [facility] hospital follow-up appointment after discharge.  The patient does have transportation limitations that hinder transportation to clinic appointments.  Signed: Selina Cooley, MD 03/10/2015, 10:46 PM

## 2015-03-11 ENCOUNTER — Inpatient Hospital Stay (HOSPITAL_COMMUNITY): Payer: Medicare Other

## 2015-03-11 ENCOUNTER — Encounter (HOSPITAL_COMMUNITY): Payer: Self-pay | Admitting: General Practice

## 2015-03-11 DIAGNOSIS — K7031 Alcoholic cirrhosis of liver with ascites: Secondary | ICD-10-CM | POA: Insufficient documentation

## 2015-03-11 DIAGNOSIS — E872 Acidosis: Secondary | ICD-10-CM

## 2015-03-11 DIAGNOSIS — R188 Other ascites: Secondary | ICD-10-CM

## 2015-03-11 DIAGNOSIS — E43 Unspecified severe protein-calorie malnutrition: Secondary | ICD-10-CM

## 2015-03-11 DIAGNOSIS — I4891 Unspecified atrial fibrillation: Secondary | ICD-10-CM

## 2015-03-11 DIAGNOSIS — I10 Essential (primary) hypertension: Secondary | ICD-10-CM

## 2015-03-11 DIAGNOSIS — G9340 Encephalopathy, unspecified: Secondary | ICD-10-CM

## 2015-03-11 DIAGNOSIS — E785 Hyperlipidemia, unspecified: Secondary | ICD-10-CM

## 2015-03-11 LAB — CBC
HEMATOCRIT: 29.8 % — AB (ref 39.0–52.0)
Hemoglobin: 10.5 g/dL — ABNORMAL LOW (ref 13.0–17.0)
MCH: 34.3 pg — ABNORMAL HIGH (ref 26.0–34.0)
MCHC: 35.2 g/dL (ref 30.0–36.0)
MCV: 97.4 fL (ref 78.0–100.0)
PLATELETS: 273 10*3/uL (ref 150–400)
RBC: 3.06 MIL/uL — ABNORMAL LOW (ref 4.22–5.81)
RDW: 15.2 % (ref 11.5–15.5)
WBC: 11.4 10*3/uL — AB (ref 4.0–10.5)

## 2015-03-11 LAB — COMPREHENSIVE METABOLIC PANEL
ALBUMIN: 2.1 g/dL — AB (ref 3.5–5.0)
ALT: 17 U/L (ref 17–63)
ANION GAP: 7 (ref 5–15)
AST: 31 U/L (ref 15–41)
Alkaline Phosphatase: 74 U/L (ref 38–126)
BUN: 5 mg/dL — ABNORMAL LOW (ref 6–20)
CO2: 30 mmol/L (ref 22–32)
Calcium: 8.3 mg/dL — ABNORMAL LOW (ref 8.9–10.3)
Chloride: 95 mmol/L — ABNORMAL LOW (ref 101–111)
Creatinine, Ser: 0.64 mg/dL (ref 0.61–1.24)
GFR calc non Af Amer: >60 mL/min (ref >60)
GLUCOSE: 109 mg/dL — AB (ref 65–99)
POTASSIUM: 3.4 mmol/L — AB (ref 3.5–5.1)
SODIUM: 132 mmol/L — AB (ref 135–145)
TOTAL PROTEIN: 4.9 g/dL — AB (ref 6.5–8.1)
Total Bilirubin: 3.4 mg/dL — ABNORMAL HIGH (ref 0.3–1.2)

## 2015-03-11 LAB — CBC WITH DIFFERENTIAL/PLATELET
BASOS ABS: 0.1 10*3/uL (ref 0.0–0.1)
BASOS PCT: 0 %
Eosinophils Absolute: 0.1 10*3/uL (ref 0.0–0.7)
Eosinophils Relative: 1 %
HEMATOCRIT: 25.9 % — AB (ref 39.0–52.0)
HEMOGLOBIN: 9 g/dL — AB (ref 13.0–17.0)
LYMPHS PCT: 18 %
Lymphs Abs: 2.1 10*3/uL (ref 0.7–4.0)
MCH: 34.5 pg — ABNORMAL HIGH (ref 26.0–34.0)
MCHC: 34.7 g/dL (ref 30.0–36.0)
MCV: 99.2 fL (ref 78.0–100.0)
MONO ABS: 1.5 10*3/uL — AB (ref 0.1–1.0)
MONOS PCT: 13 %
NEUTROS ABS: 7.6 10*3/uL (ref 1.7–7.7)
NEUTROS PCT: 68 %
Platelets: 238 10*3/uL (ref 150–400)
RBC: 2.61 MIL/uL — ABNORMAL LOW (ref 4.22–5.81)
RDW: 15.2 % (ref 11.5–15.5)
WBC: 11.3 10*3/uL — ABNORMAL HIGH (ref 4.0–10.5)

## 2015-03-11 LAB — FERRITIN: Ferritin: 1018 ng/mL — ABNORMAL HIGH (ref 24–336)

## 2015-03-11 LAB — IRON AND TIBC
Iron: 95 ug/dL (ref 45–182)
SATURATION RATIOS: 66 % — AB (ref 17.9–39.5)
TIBC: 144 ug/dL — AB (ref 250–450)
UIBC: 49 ug/dL

## 2015-03-11 LAB — AMYLASE: AMYLASE: 63 U/L (ref 28–100)

## 2015-03-11 LAB — RETICULOCYTES
RBC.: 2.79 MIL/uL — AB (ref 4.22–5.81)
Retic Count, Absolute: 142.3 10*3/uL (ref 19.0–186.0)
Retic Ct Pct: 5.1 % — ABNORMAL HIGH (ref 0.4–3.1)

## 2015-03-11 LAB — VITAMIN B12: VITAMIN B 12: 854 pg/mL (ref 180–914)

## 2015-03-11 LAB — HIV ANTIBODY (ROUTINE TESTING W REFLEX): HIV SCREEN 4TH GENERATION: NONREACTIVE

## 2015-03-11 LAB — DIGOXIN LEVEL: DIGOXIN LVL: 0.7 ng/mL — AB (ref 0.8–2.0)

## 2015-03-11 LAB — LACTIC ACID, PLASMA: Lactic Acid, Venous: 2 mmol/L (ref 0.5–2.0)

## 2015-03-11 LAB — FOLATE: FOLATE: 10 ng/mL (ref >5.9)

## 2015-03-11 MED ORDER — INFLUENZA VAC SPLIT QUAD 0.5 ML IM SUSY
0.5000 mL | PREFILLED_SYRINGE | INTRAMUSCULAR | Status: AC
  Administered 2015-03-12: 0.5 mL via INTRAMUSCULAR
  Filled 2015-03-11: qty 0.5

## 2015-03-11 MED ORDER — DIGOXIN 125 MCG PO TABS
0.1250 mg | ORAL_TABLET | Freq: Every day | ORAL | Status: DC
  Administered 2015-03-12 – 2015-03-14 (×3): 0.125 mg via ORAL
  Filled 2015-03-11 (×3): qty 1

## 2015-03-11 MED ORDER — BOOST PLUS PO LIQD
237.0000 mL | Freq: Two times a day (BID) | ORAL | Status: DC
  Administered 2015-03-11 – 2015-03-14 (×4): 237 mL via ORAL
  Filled 2015-03-11 (×9): qty 237

## 2015-03-11 MED ORDER — FUROSEMIDE 10 MG/ML IJ SOLN
40.0000 mg | Freq: Two times a day (BID) | INTRAMUSCULAR | Status: DC
  Administered 2015-03-11 – 2015-03-12 (×3): 40 mg via INTRAVENOUS
  Filled 2015-03-11 (×3): qty 4

## 2015-03-11 MED ORDER — PANTOPRAZOLE SODIUM 40 MG IV SOLR
40.0000 mg | Freq: Two times a day (BID) | INTRAVENOUS | Status: DC
  Administered 2015-03-11 – 2015-03-14 (×7): 40 mg via INTRAVENOUS
  Filled 2015-03-11 (×8): qty 40

## 2015-03-11 MED ORDER — HALOPERIDOL LACTATE 5 MG/ML IJ SOLN
2.0000 mg | Freq: Once | INTRAMUSCULAR | Status: AC
  Administered 2015-03-11: 2 mg via INTRAVENOUS
  Filled 2015-03-11: qty 1

## 2015-03-11 MED ORDER — SPIRONOLACTONE 25 MG PO TABS
100.0000 mg | ORAL_TABLET | Freq: Every day | ORAL | Status: DC
  Administered 2015-03-12: 100 mg via ORAL
  Filled 2015-03-11: qty 4

## 2015-03-11 MED ORDER — RISPERIDONE 0.5 MG PO TBDP
0.5000 mg | ORAL_TABLET | Freq: Once | ORAL | Status: DC
  Filled 2015-03-11: qty 1

## 2015-03-11 MED ORDER — LACTULOSE 10 GM/15ML PO SOLN
30.0000 g | Freq: Three times a day (TID) | ORAL | Status: DC
  Administered 2015-03-11 (×2): 30 g via ORAL
  Filled 2015-03-11: qty 45

## 2015-03-11 MED ORDER — HALOPERIDOL LACTATE 5 MG/ML IJ SOLN: 1.0000 mg | Freq: Every day | INTRAMUSCULAR | Status: DC | PRN

## 2015-03-11 MED ORDER — HALOPERIDOL LACTATE 5 MG/ML IJ SOLN
1.0000 mg | Freq: Once | INTRAMUSCULAR | Status: AC
  Administered 2015-03-11: 1 mg via INTRAVENOUS
  Filled 2015-03-11: qty 1

## 2015-03-11 NOTE — Progress Notes (Addendum)
I was paged by RN Green[place] at 46 that Mark Stephens was agitated and trying to get out of bed, saying he wanted to go home.  When I went to his home, he was indeed trying to climb over the rail to get to his wife who was sitting in the recliner. He was restless but said he felt good and wasn't in any pain. He was slightly tachycardic to 90 but otherwise hemodynamically stable. His wife requested something to help him sleep as he doesn't usually get this agitated at home. He sleeps quite well at night and stays awake during the day.  I think this is delirium from being in a new environment atop his working diagnosis of hepatic encephalopathy. I ordered  haloperidol IV and will monitor his rates to ensure they come down once he becomes less agitated.  Selina Cooley MD  ADDENDUM 0230:  I was paged again by RN Greenland at 230 that patient was still agitated 30 minutes after getting first  dose of haloperidol, and was banging his head on the side rail. Because his QTc is slightly prolonged at 450, I will order risperidone 0.5mg  dissolving tablet as this has been shown to have a lesser effect on QT prolongation.  Selina Cooley MD

## 2015-03-11 NOTE — Progress Notes (Addendum)
Subjective:  Patient seen and examined at bedside. He was still confused and was alert to name only. Wife present at bedside, said his confusion has been chronically progressing, rather than acutely. He also reported BM in AM, black.    Objective: Vital signs in last 24 hours: Filed Vitals:   03/10/15 2130 03/10/15 2156 03/10/15 2311 03/11/15 0612  BP: 119/61  122/66 115/54  Pulse:   95 87  Temp:  98.4 F (36.9 C) 99.8 F (37.7 C) 98.6 F (37 C)  TempSrc:   Oral Oral  Resp: Height:     (1.803 m)  Weight:    147 lb 7.8 oz (66.9 kg)  SpO2:   97% 99%   Weight change:   Intake/Output Summary (Last 24 hours) at 03/11/15 1412 Last data filed at 03/11/15 1401  Gross per 24 hour  Intake  403.5 ml  Output      0 ml  Net  403.5 ml   General: Pt lying in bed, thin. HEENT: has scleral icterus Cardiovascular: irregular and tachycardic  Pulmonary/Chest: Clear to auscultation bilaterally, no wheezes, rales, or rhonchi. Abdominal: Soft, some distension , non tender to palpation Extremities: 2+ pitting edema in LE , pulses symmetric and intact bilaterally. Skin: Warm, dry and intact. No rashes or erythema, no asterixis  Neuro: Alert to name but not place, and date, has some confabulation present, no dysarthria ,   Lab Results: Results for orders placed or performed during the hospital encounter of 03/10/15 (from the past 24 hour(s))  I-Stat CG4 Lactic Acid, ED  (not at [facility])     Status: Abnormal   Collection Time: 03/10/15  7:48 PM  Result Value Ref Range   Lactic Acid, Venous 4.86 (HH) 0.5 - 2.0 mmol/L   Comment NOTIFIED PHYSICIAN   Ammonia     Status: Abnormal   Collection Time: 03/10/15  7:53 PM  Result Value Ref Range   Ammonia 55 (H) 9 - 35 umol/L  Ethanol     Status: None   Collection Time: 03/10/15  7:53 PM  Result Value Ref Range   Alcohol, Ethyl (B) <5 <5 mg/dL  Comprehensive metabolic panel     Status: Abnormal   Collection Time: 03/10/15   7:54 PM  Result Value Ref Range   Sodium 130 (L) 135 - 145 mmol/L   Potassium 3.4 (L) 3.5 - 5.1 mmol/L   Chloride 94 (L) 101 - 111 mmol/L   CO2 19 (L) 22 - 32 mmol/L   Glucose, Bld 119 (H) 65 - 99 mg/dL   BUN 7 6 - 20 mg/dL   Creatinine, Ser 8.09 0.61 - 1.24 mg/dL   Calcium 8.4 (L) 8.9 - 10.3 mg/dL   Total Protein 6.1 (L) 6.5 - 8.1 g/dL   Albumin 2.3 (L) 3.5 - 5.0 g/dL   AST 52 (H) 15 - 41 U/L   ALT 18 17 - 63 U/L   Alkaline Phosphatase 87 38 - 126 U/L   Total Bilirubin 3.4 (H) 0.3 - 1.2 mg/dL   GFR calc non Af Amer >60 >60 mL/min   GFR calc Af Amer >60 >60 mL/min   Anion gap 17 (H) 5 - 15  CBC WITH DIFFERENTIAL     Status: Abnormal   Collection Time: 03/10/15  7:54 PM  Result Value Ref Range   WBC 12.5 (H) 4.0 - 10.5 K/uL   RBC 3.12 (L) 4.22 - 5.81 MIL/uL   Hemoglobin 10.8 (L) 13.0 -  17.0 g/dL   HCT 81.1 (L) 91.4 - 78.2 %   MCV 98.4 78.0 - 100.0 fL   MCH 34.6 (H) 26.0 - 34.0 pg   MCHC 35.2 30.0 - 36.0 g/dL   RDW 95.6 21.3 - 08.6 %   Platelets 259 150 - 400 K/uL   Neutrophils Relative % 74 %   Neutro Abs 9.3 (H) 1.7 - 7.7 K/uL   Lymphocytes Relative 13 %   Lymphs Abs 1.6 0.7 - 4.0 K/uL   Monocytes Relative 13 %   Monocytes Absolute 1.6 (H) 0.1 - 1.0 K/uL   Eosinophils Relative 0 %   Eosinophils Absolute 0.0 0.0 - 0.7 K/uL   Basophils Relative 0 %   Basophils Absolute 0.1 0.0 - 0.1 K/uL  Protime-INR     Status: Abnormal   Collection Time: 03/10/15  7:54 PM  Result Value Ref Range   Prothrombin Time 19.7 (H) 11.6 - 15.2 seconds   INR 1.67 (H) 0.00 - 1.49  Urinalysis, Routine w reflex microscopic (not at [facility])     Status: Abnormal   Collection Time: 03/10/15  9:39 PM  Result Value Ref Range   Color, Urine AMBER (A) YELLOW   APPearance CLOUDY (A) CLEAR   Specific Gravity, Urine 1.016 1.005 - 1.030   pH 5.5 5.0 - 8.0   Glucose, UA NEGATIVE NEGATIVE mg/dL   Hgb urine dipstick NEGATIVE NEGATIVE   Bilirubin Urine SMALL (A) NEGATIVE   Ketones, ur NEGATIVE NEGATIVE mg/dL    Protein, ur NEGATIVE NEGATIVE mg/dL   Urobilinogen, UA 1.0 0.0 - 1.0 mg/dL   Nitrite NEGATIVE NEGATIVE   Leukocytes, UA SMALL (A) NEGATIVE  Urine microscopic-add on     Status: Abnormal   Collection Time: 03/10/15  9:39 PM  Result Value Ref Range   WBC, UA 3-6 <3 WBC/hpf   RBC / HPF 0-2 <3 RBC/hpf   Bacteria, UA RARE RARE   Casts HYALINE CASTS (A) NEGATIVE   Crystals CA OXALATE CRYSTALS (A) NEGATIVE  Digoxin level     Status: Abnormal   Collection Time: 03/11/15 12:00 AM  Result Value Ref Range   Digoxin Level 0.7 (L) 0.8 - 2.0 ng/mL  HIV antibody     Status: None   Collection Time: 03/11/15 12:00 AM  Result Value Ref Range   HIV Screen 4th Generation wRfx Non Reactive Non Reactive  Lactic acid, plasma     Status: None   Collection Time: 03/11/15 12:00 AM  Result Value Ref Range   Lactic Acid, Venous 2.0 0.5 - 2.0 mmol/L  Vitamin B12     Status: None   Collection Time: 03/11/15 12:00 AM  Result Value Ref Range   Vitamin B-12 854 180 - 914 pg/mL  Folate     Status: None   Collection Time: 03/11/15 12:00 AM  Result Value Ref Range   Folate 10.0 >5.9 ng/mL  Iron and TIBC     Status: Abnormal   Collection Time: 03/11/15 12:00 AM  Result Value Ref Range   Iron 95 45 - 182 ug/dL   TIBC 578 (L) 469 - 629 ug/dL   Saturation Ratios 66 (H) 17.9 - 39.5 %   UIBC 49 ug/dL  Ferritin     Status: Abnormal   Collection Time: 03/11/15 12:00 AM  Result Value Ref Range   Ferritin 1018 (H) 24 - 336 ng/mL  Reticulocytes     Status: Abnormal   Collection Time: 03/11/15 12:00 AM  Result Value Ref Range   Retic Ct  Pct 5.1 (H) 0.4 - 3.1 %   RBC. 2.79 (L) 4.22 - 5.81 MIL/uL   Retic Count, Manual 142.3 19.0 - 186.0 K/uL  Comprehensive metabolic panel     Status: Abnormal   Collection Time: 03/11/15  5:48 AM  Result Value Ref Range   Sodium 132 (L) 135 - 145 mmol/L   Potassium 3.4 (L) 3.5 - 5.1 mmol/L   Chloride 95 (L) 101 - 111 mmol/L   CO2 30 22 - 32 mmol/L   Glucose, Bld 109 (H) 65 -  99 mg/dL   BUN 5 (L) 6 - 20 mg/dL   Creatinine, Ser 4.09 0.61 - 1.24 mg/dL   Calcium 8.3 (L) 8.9 - 10.3 mg/dL   Total Protein 4.9 (L) 6.5 - 8.1 g/dL   Albumin 2.1 (L) 3.5 - 5.0 g/dL   AST 31 15 - 41 U/L   ALT 17 17 - 63 U/L   Alkaline Phosphatase 74 38 - 126 U/L   Total Bilirubin 3.4 (H) 0.3 - 1.2 mg/dL   GFR calc non Af Amer >60 >60 mL/min   GFR calc Af Amer >60 >60 mL/min   Anion gap 7 5 - 15  CBC WITH DIFFERENTIAL     Status: Abnormal   Collection Time: 03/11/15  5:48 AM  Result Value Ref Range   WBC 11.3 (H) 4.0 - 10.5 K/uL   RBC 2.61 (L) 4.22 - 5.81 MIL/uL   Hemoglobin 9.0 (L) 13.0 - 17.0 g/dL   HCT 81.1 (L) 91.4 - 78.2 %   MCV 99.2 78.0 - 100.0 fL   MCH 34.5 (H) 26.0 - 34.0 pg   MCHC 34.7 30.0 - 36.0 g/dL   RDW 95.6 21.3 - 08.6 %   Platelets 238 150 - 400 K/uL   Neutrophils Relative % 68 %   Neutro Abs 7.6 1.7 - 7.7 K/uL   Lymphocytes Relative 18 %   Lymphs Abs 2.1 0.7 - 4.0 K/uL   Monocytes Relative 13 %   Monocytes Absolute 1.5 (H) 0.1 - 1.0 K/uL   Eosinophils Relative 1 %   Eosinophils Absolute 0.1 0.0 - 0.7 K/uL   Basophils Relative 0 %   Basophils Absolute 0.1 0.0 - 0.1 K/uL     Micro Results: No results found for this or any previous visit (from the past 240 hour(s)). Studies/Results: Ct Head Wo Contrast  03/10/2015   CLINICAL DATA:  Altered mental status.  EXAM: CT HEAD WITHOUT CONTRAST  TECHNIQUE: Contiguous axial images were obtained from the base of the skull through the vertex without intravenous contrast.  COMPARISON:  02/03/2015 head CT.  FINDINGS: No evidence of parenchymal hemorrhage or extra-axial fluid collection. There are stable mild calcifications in the bilateral globus pallidus. No mass lesion, mass effect, or midline shift.  No CT evidence of acute infarction. There is intracranial atherosclerotic arterial calcification. Nonspecific stable mild-to-moderate subcortical and periventricular white matter hypodensity, most in keeping with chronic  small vessel ischemic change. There is stable diffuse mild cerebral volume loss. No ventriculomegaly.  The visualized paranasal sinuses are essentially clear. The mastoid air cells are unopacified. No evidence of calvarial fracture.  IMPRESSION: 1. No acute intracranial abnormality. 2. Intracranial atherosclerosis, mild diffuse cerebral volume loss and nonspecific chronic small vessel ischemic white matter change, stable.   Electronically Signed   By: Delbert Phenix M.D.   On: 03/10/2015 20:13   US Abdomen Limited  03/11/2015   CLINICAL DATA:  81 year old male with abdominal distention, query ascites. Initial encounter.  EXAM:  LIMITED ABDOMEN ULTRASOUND FOR ASCITES  TECHNIQUE: Limited ultrasound survey for ascites was performed in all four abdominal quadrants.  COMPARISON:  Images from [facility] ultrasound-guided paracentesis 02/07/2015  FINDINGS: Simple appearing ascites fluid visible in all 4 quadrants of the abdomen. Overall volume is moderate to large.  IMPRESSION: Moderate to large volume of ascites throughout the abdomen.   Electronically Signed   By: Odessa Fleming M.D.   On: 03/11/2015 00:08   Dg Chest Port 1 View  03/10/2015   CLINICAL DATA:  History per patient's family with reported altered mental status and chest pain. History of dementia and atrial fibrillation.  EXAM: PORTABLE CHEST 1 VIEW  COMPARISON:  02/03/2015  FINDINGS: Cardiac silhouette is mildly enlarged. No mediastinal or hilar masses or evidence of adenopathy. Mild basilar opacity is noted similar to the prior study, likely atelectasis. No convincing pneumonia and no pulmonary edema. No pneumothorax.  Bony thorax is intact.  IMPRESSION: No acute cardiopulmonary disease.   Electronically Signed   By: Amie Portland M.D.   On: 03/10/2015 20:20   Medications: I have reviewed the patient's current medications. Scheduled Meds: . sodium chloride   Intravenous STAT  . allopurinol  100 mg Oral Daily  . buPROPion  150 mg  Oral BID  . digoxin  0.125 mg Oral Daily  . folic acid  400 mcg Oral Daily  . furosemide  40 mg Oral Daily  . [START ON 03/12/2015] Influenza vac split quadrivalent PF  0.5 mL Intramuscular Tomorrow-1000  . lactulose  300 mL Rectal BID  . lisinopril  20 mg Oral Daily  . metoprolol succinate  50 mg Oral Daily  . pantoprazole (PROTONIX) IV  40 mg Intravenous Q12H  . risperiDONE  0.5 mg Oral Once  . spironolactone  50 mg Oral Daily  . tamsulosin  0.4 mg Oral Daily   Continuous Infusions:  PRN Meds:. Assessment/Plan:  81 yo man with history of alcoholic cirrhosis with MELD of 22 presenting with progressive confusion over the last month (last 6 months per wife), and some aabdominal distension, constipation and melena.  Progressive confusion in the setting of alcoholic cirrhosis and dementia: He could possibly have HE, but his confusion was not acute, but rather present since last Christmas per wife. Also, he was conversing normally, it was just that he was not oriented and was confabulating some. But MELD score 22 He does have baseline dementia and per wife, he started forgetting things since last Christmas.  His last drink was in Christmas and since then the wife has been giving him grape juice when he asks for wine. He took a long time to answer questions, so had prolonged mentation. Ferritin markedly high and high retic count. Ammonia high.  -Wife mentioned later in afternoon that she was real concerned that he may have Lewy Body dementia as he had tremors on right hand and  visual hallucinations, and slowed mentation. He would often ask her to make marks on wall [place]. Son thought he had Parkinsons. We will look into that.    -Lactulose bid, He reported BM today morning and it was black. -protonix 40 mg bid -u/s showed moderate ascites- paracentesis unable to be performed as there was not enough fluid, may need IR -FOBT ordered -will obtain records from  GI and PCP -NS at 10 ml/hr for 12 hours -SLp recommended dysphagia diet 3 due to inability to initiate swallow but intact esophageal swallowing -  haldol PRN for agitation -spironolactone 100 mg  CHF:? EF of 45% in 2011  -echo pending -continue lisinopril, lasix, digoxin,spironolactone   Metabolic acidosis with AG and lactic acidosis -lactate has normalized, anion gap closed -echo pending -BMP shows high total bili -continue NS  Afib: he was tachycardic with rates in 100s -Continue xarelto -metoprolol xl 50 mg  Anemia: Ferritin markedly high and high retic count. Hgb of 10.8, but last month it was 10.7 -do not suspect acute bleed -ordered FOBT       Dispo: Disposition is deferred at this time, awaiting improvement of current medical problems.  Anticipated discharge in approximately 2 day(s).   The patient does have a current PCP Kandyce Rud, MD) and does need an [facility] hospital follow-up appointment after discharge.  The patient does have transportation limitations that hinder transportation to clinic appointments.  .Services Needed at time of discharge: Y = Yes, Blank = No PT:   OT:   RN:   Equipment:   Other:     LOS: 1 day   Deneise Lever, MD 03/11/2015, 2:12 PM

## 2015-03-11 NOTE — Progress Notes (Signed)
Made Dr. Earnest Conroy aware pt still agitated after  of Haldol, pt was banging his head on the side rail, pt wife sitting at the bedside. Awaiting orders to be placed.

## 2015-03-11 NOTE — Progress Notes (Signed)
Made Dr. Earnest Conroy aware pt agitated, trying to climb out of bed, yelling at his wife, requesting to leave and spitting on the floor. Also informed Dr. Earnest Conroy, pt failed swallow screen in the ED, pt is NPO. Awaiting orders to be placed will continue to monitor.

## 2015-03-11 NOTE — Progress Notes (Signed)
Initial Nutrition Assessment  DOCUMENTATION CODES:   Severe malnutrition in context of acute illness/injury  INTERVENTION:   -Boost Plus BID between meals  NUTRITION DIAGNOSIS:   Malnutrition related to acute illness as evidenced by moderate depletions of muscle mass, moderate depletion of body fat.  GOAL:   Patient will meet greater than or equal to 90% of their needs  MONITOR:   PO intake, Supplement acceptance, Labs, Weight trends, Skin, I & O's  REASON FOR ASSESSMENT:   Malnutrition Screening Tool    ASSESSMENT:   [name] is a 81 year old man with history of alcoholic cirrhosis, atrial fibrillation, and hypertension presenting with progressive confusion and difficulty swallowing over the last two months. His wife provided the history as the patient was oriented only to person, confabulating, and clearly confused. Over the last two months, he has been more confused, sometimes hallucinating and seeing "markers" on the wall. During this time, his abdomen has gotten more distended since a paracentesis one month ago when they pulled off 3 liters of fluid, and his legs have been swelling quite a bite. He's also been constipated, and he has never tried taking lactulose or rifaximin. The wife also reports that his stools have been "jet black," which she attributed to all of the grape juice he drinks, which he thinks is wine (the patient quit drinking alcohol one year ago).   Pt admitted with progressive confusion, likely due to hepatic encephalopathy.   Hx obtained by pt and family member at bedside. Per chart review, pt was very agitated overnight, however, pleasant and cooperative during RD visit. Both pt and family confirm poor appetite and weight loss. He reveals UBW is around 160#. Wt hx reveals progressive wt loss over the past 8 months.   BSE recently completed; pt has been advanced to a dysphagia 3 diet. Per pt, he did not have any difficulty tolerating the textures of  foods and liquids provided to him during swallow evaluation. He is looking forward to eating lunch. Family members report he consumed Boost BID at home and would like supplement continued in hospital. Family member also curious about what foods comprise of a dysphagia 3 diet; discussed principles and rationale of diet.   Nutrition-Focused physical exam completed. Findings are mild to moderate fat depletion, mild to moderate muscle depletion, and mild edema.   Labs reviewed: Na: 132, K: 3.4.   Diet Order:  DIET DYS 3 Room service appropriate?: Yes; Fluid consistency:: Thin  Skin:  Reviewed, no issues  Last BM:  03/11/15  Height:   Ht Readings from Last 1 Encounters:  03/11/15  (1.803 m)    Weight:   Wt Readings from Last 1 Encounters:  03/11/15 147 lb 7.8 oz (66.9 kg)    Ideal Body Weight:  78.2 kg  BMI:  Body mass index is 20.58 kg/(m^2).  Estimated Nutritional Needs:   Kcal:  1750-1950  Protein:  100-115 grams  Fluid:  >1.7 L  EDUCATION NEEDS:   Education needs addressed  Favor Hackler A. Mayford Knife, RD, LDN, CDE Pager: [phone] After hours Pager: [phone]

## 2015-03-11 NOTE — Progress Notes (Signed)
  Echocardiogram 2D Echocardiogram has been performed.  Nolon Rod 03/11/2015, 1:46 PM

## 2015-03-11 NOTE — Evaluation (Signed)
Clinical/Bedside Swallow Evaluation Patient Details  Name: Mark MANCUSO Sr. MRN: [id number] Date of Birth: 09-Jul-1933  Today's Date: 03/11/2015 Time: SLP Start Time (ACUTE ONLY): 1052 SLP Stop Time (ACUTE ONLY): 1113 SLP Time Calculation (min) (ACUTE ONLY): 21 min  Past Medical History:  Past Medical History  Diagnosis Date  . Hypertension   . Heart murmur   . Atrial fibrillation (HCC) 11/27/14  . Dementia   . Dysrhythmia   . Liver cirrhosis (HCC)   . Cirrhosis of liver (HCC)   . Ascites    Past Surgical History:  Past Surgical History  Procedure Laterality Date  . Appendectomy    . Total hip arthroplasty  2012    right  . Joint replacement    . Esophagogastroduodenoscopy (egd) with propofol N/A 02/06/2015    Procedure: ESOPHAGOGASTRODUODENOSCOPY (EGD) WITH PROPOFOL;  Surgeon: Wallace Cullens, MD;  Location: [facility] ENDOSCOPY;  Service: Gastroenterology;  Laterality: N/A;   HPI:  Mark Stephens is a 81 year old man with history of alcoholic cirrhosis, atrial fibrillation, and hypertension presenting with progressive confusion and difficulty swallowing.   Assessment / Plan / Recommendation Clinical Impression  Mark Stephens has a cognitively-based oral dysphagia with reduced initiation and bolus awareness that leads to oral holding and incomplete A/P transit with sublingual pooling of residuals. Mark Stephens requires Mod cues from SLP for adequate oral preparation and propulsion, yet pharyngeal phase occurs swiftly and without evidence of decreased airway protection. Recommend Dys 3 diet and thin liquids with full supervision to assist with cognitive management of oral dysphagia. SLP to f/u acutely - Mark Stephens will also benefit from f/u [facility] SLP services.    Aspiration Risk  Mild    Diet Recommendation Dysphagia 3 (Mech soft);Thin   Medication Administration: Whole meds with puree Compensations: Minimize environmental distractions;Slow rate;Small sips/bites;Follow solids with liquid    Other  Recommendations  Oral Care Recommendations: Oral care BID   Follow Up Recommendations   HH SLP    Frequency and Duration min 2x/week  2 weeks   Pertinent Vitals/Pain n/a    SLP Swallow Goals     Swallow Study Prior Functional Status       General Other Pertinent Information: Mark Stephens is a 70 year old man with history of alcoholic cirrhosis, atrial fibrillation, and hypertension presenting with progressive confusion and difficulty swallowing. Type of Study: Bedside swallow evaluation Previous Swallow Assessment: none in chart Diet Prior to this Study: NPO Temperature Spikes Noted: No Respiratory Status: Room air History of Recent Intubation: No Behavior/Cognition: Alert;Cooperative;Pleasant mood;Confused;Requires cueing Oral Cavity - Dentition: Adequate natural dentition/normal for age Self-Feeding Abilities: Able to feed self Patient Positioning: Upright in bed Baseline Vocal Quality: Normal    Oral/Motor/Sensory Function Overall Oral Motor/Sensory Function: Appears within functional limits for tasks assessed   Ice Chips Ice chips: Not tested   Thin Liquid Thin Liquid: Impaired Presentation: Cup;Self Fed;Straw Oral Phase Impairments: Poor awareness of bolus Oral Phase Functional Implications: Oral holding;Oral residue    Nectar Thick Nectar Thick Liquid: Not tested   Honey Thick Honey Thick Liquid: Not tested   Puree Puree: Within functional limits Presentation: Self Fed;Spoon   Solid      Solid: Impaired Presentation: Self Fed Oral Phase Impairments: Impaired mastication Oral Phase Functional Implications: Oral residue;Oral holding      Maxcine Ham, M.A. CCC-SLP [phone]  Maxcine Ham 03/11/2015,11:30 AM

## 2015-03-12 ENCOUNTER — Inpatient Hospital Stay (HOSPITAL_COMMUNITY): Payer: Medicare Other

## 2015-03-12 DIAGNOSIS — K7290 Hepatic failure, unspecified without coma: Secondary | ICD-10-CM | POA: Diagnosis not present

## 2015-03-12 LAB — COMPREHENSIVE METABOLIC PANEL
ALK PHOS: 72 U/L (ref 38–126)
ALT: 15 U/L — AB (ref 17–63)
ANION GAP: 8 (ref 5–15)
AST: 29 U/L (ref 15–41)
Albumin: 2 g/dL — ABNORMAL LOW (ref 3.5–5.0)
BILIRUBIN TOTAL: 2.6 mg/dL — AB (ref 0.3–1.2)
BUN: <5 mg/dL — ABNORMAL LOW (ref 6–20)
CALCIUM: 8.1 mg/dL — AB (ref 8.9–10.3)
CO2: 29 mmol/L (ref 22–32)
CREATININE: 0.62 mg/dL (ref 0.61–1.24)
Chloride: 96 mmol/L — ABNORMAL LOW (ref 101–111)
GFR calc non Af Amer: >60 mL/min (ref >60)
Glucose, Bld: 103 mg/dL — ABNORMAL HIGH (ref 65–99)
Potassium: 2.7 mmol/L — CL (ref 3.5–5.1)
Sodium: 133 mmol/L — ABNORMAL LOW (ref 135–145)
TOTAL PROTEIN: 4.9 g/dL — AB (ref 6.5–8.1)

## 2015-03-12 LAB — LACTATE DEHYDROGENASE, PLEURAL OR PERITONEAL FLUID: LD FL: 42 U/L — AB (ref 3–23)

## 2015-03-12 LAB — ALBUMIN, FLUID (OTHER): Albumin, Fluid: <1 g/dL

## 2015-03-12 LAB — BODY FLUID CELL COUNT WITH DIFFERENTIAL
EOS FL: 0 %
LYMPHS FL: 37 %
MONOCYTE-MACROPHAGE-SEROUS FLUID: 42 % — AB (ref 50–90)
NEUTROPHIL FLUID: 21 % (ref 0–25)
WBC FLUID: 137 uL (ref 0–1000)

## 2015-03-12 LAB — GRAM STAIN

## 2015-03-12 LAB — HEPATITIS C ANTIBODY (REFLEX): HCV Ab: <0.1 s/co ratio (ref 0.0–0.9)

## 2015-03-12 LAB — CBC
HCT: 25.8 % — ABNORMAL LOW (ref 39.0–52.0)
HEMOGLOBIN: 8.9 g/dL — AB (ref 13.0–17.0)
MCH: 34 pg (ref 26.0–34.0)
MCHC: 34.5 g/dL (ref 30.0–36.0)
MCV: 98.5 fL (ref 78.0–100.0)
PLATELETS: 235 10*3/uL (ref 150–400)
RBC: 2.62 MIL/uL — AB (ref 4.22–5.81)
RDW: 15 % (ref 11.5–15.5)
WBC: 9 10*3/uL (ref 4.0–10.5)

## 2015-03-12 LAB — HCV COMMENT:

## 2015-03-12 LAB — PROTIME-INR
INR: 1.85 — AB (ref 0.00–1.49)
PROTHROMBIN TIME: 21.3 s — AB (ref 11.6–15.2)

## 2015-03-12 LAB — PROTEIN, BODY FLUID: Total protein, fluid: <3 g/dL

## 2015-03-12 LAB — OCCULT BLOOD X 1 CARD TO LAB, STOOL: FECAL OCCULT BLD: NEGATIVE

## 2015-03-12 LAB — TRANSFERRIN: TRANSFERRIN: 107 mg/dL — AB (ref 180–329)

## 2015-03-12 LAB — GLUCOSE, PERITONEAL FLUID: Glucose, Peritoneal Fluid: 135 mg/dL

## 2015-03-12 MED ORDER — LIDOCAINE HCL (PF) 1 % IJ SOLN
INTRAMUSCULAR | Status: AC
  Filled 2015-03-12: qty 10

## 2015-03-12 MED ORDER — POTASSIUM CHLORIDE CRYS ER 20 MEQ PO TBCR
60.0000 meq | EXTENDED_RELEASE_TABLET | Freq: Two times a day (BID) | ORAL | Status: AC
Start: 2015-03-12 — End: 2015-03-12
  Administered 2015-03-12 (×2): 60 meq via ORAL
  Filled 2015-03-12 (×2): qty 3

## 2015-03-12 MED ORDER — LACTULOSE 10 GM/15ML PO SOLN
30.0000 g | Freq: Two times a day (BID) | ORAL | Status: DC
  Administered 2015-03-12: 30 g via ORAL
  Filled 2015-03-12 (×3): qty 45

## 2015-03-12 MED ORDER — FUROSEMIDE 10 MG/ML IJ SOLN
40.0000 mg | Freq: Every day | INTRAMUSCULAR | Status: DC
  Administered 2015-03-13: 40 mg via INTRAVENOUS
  Filled 2015-03-12: qty 4

## 2015-03-12 NOTE — Progress Notes (Signed)
Subjective:  Patient seen and examined at bedside. He and wife said he was feeling better He has been having loose brown BM 3-4 times since midnight , fobt has been collected He is alert to place andm name, but not year and president.    Objective: Vital signs in last 24 hours: Filed Vitals:   03/11/15 1527 03/11/15 2207 03/12/15 0541 03/12/15 0545  BP: 129/71 126/72  110/59  Pulse: 99 93  87  Temp: 98.6 F (37 C) 98.4 F (36.9 C)  98.6 F (37 C)  TempSrc: Oral Oral  Oral  Resp: Height:      Weight:   146 lb (66.225 kg)   SpO2: 100% 99%  89%   Weight change: -1 lb 7.8 oz (-0.675 kg)  Intake/Output Summary (Last 24 hours) at 03/12/15 1056 Last data filed at 03/12/15 0941  Gross per 24 hour  Intake    580 ml  Output    975 ml  Net   -395 ml   General: Pt sitting in chair, alert to name and place, but not time HEENT: has scleral icterus, improved from yesterday Cardiovascular: irregular and tachycardic  Pulmonary/Chest: Clear to auscultation bilaterally, no wheezes, rales, or rhonchi. Abdominal: Soft, diffusely distended and nontender to palpation Extremities: 2+ pitting edema in LE , pulses symmetric and intact bilaterally. Skin: Warm, dry and intact. No rashes or erythema, no asterixis  Lab Results Results for orders placed or performed during the hospital encounter of 03/10/15 (from the past 24 hour(s))  CBC     Status: Abnormal   Collection Time: 03/11/15  3:50 PM  Result Value Ref Range   WBC 11.4 (H) 4.0 - 10.5 K/uL   RBC 3.06 (L) 4.22 - 5.81 MIL/uL   Hemoglobin 10.5 (L) 13.0 - 17.0 g/dL   HCT 16.1 (L) 09.6 - 04.5 %   MCV 97.4 78.0 - 100.0 fL   MCH 34.3 (H) 26.0 - 34.0 pg   MCHC 35.2 30.0 - 36.0 g/dL   RDW 40.9 81.1 - 91.4 %   Platelets 273 150 - 400 K/uL  Amylase     Status: None   Collection Time: 03/11/15  3:50 PM  Result Value Ref Range   Amylase 63 28 - 100 U/L  Comprehensive metabolic panel     Status: Abnormal   Collection Time:  03/12/15  6:10 AM  Result Value Ref Range   Sodium 133 (L) 135 - 145 mmol/L   Potassium 2.7 (LL) 3.5 - 5.1 mmol/L   Chloride 96 (L) 101 - 111 mmol/L   CO2 29 22 - 32 mmol/L   Glucose, Bld 103 (H) 65 - 99 mg/dL   BUN <5 (L) 6 - 20 mg/dL   Creatinine, Ser 7.82 0.61 - 1.24 mg/dL   Calcium 8.1 (L) 8.9 - 10.3 mg/dL   Total Protein 4.9 (L) 6.5 - 8.1 g/dL   Albumin 2.0 (L) 3.5 - 5.0 g/dL   AST 29 15 - 41 U/L   ALT 15 (L) 17 - 63 U/L   Alkaline Phosphatase 72 38 - 126 U/L   Total Bilirubin 2.6 (H) 0.3 - 1.2 mg/dL   GFR calc non Af Amer >60 >60 mL/min   GFR calc Af Amer >60 >60 mL/min   Anion gap 8 5 - 15  CBC     Status: Abnormal   Collection Time: 03/12/15  6:10 AM  Result Value Ref Range   WBC 9.0 4.0 - 10.5 K/uL  RBC 2.62 (L) 4.22 - 5.81 MIL/uL   Hemoglobin 8.9 (L) 13.0 - 17.0 g/dL   HCT 16.125.8 (L) 09.639.0 - 04.552.0 %   MCV 98.5 78.0 - 100.0 fL   MCH 34.0 26.0 - 34.0 pg   MCHC 34.5 30.0 - 36.0 g/dL   RDW 40.915.0 81.111.5 - 91.415.5 %   Platelets 235 150 - 400 K/uL  Protime-INR     Status: Abnormal   Collection Time: 03/12/15  6:10 AM  Result Value Ref Range   Prothrombin Time 21.3 (H) 11.6 - 15.2 seconds   INR 1.85 (H) 0.00 - 1.49     Micro Results: No results found for this or any previous visit (from the past 240 hour(s)). Studies/Results: Ct Head Wo Contrast  03/10/2015  CLINICAL DATA:  Altered mental status. EXAM: CT HEAD WITHOUT CONTRAST TECHNIQUE: Contiguous axial images were obtained from the base of the skull through the vertex without intravenous contrast. COMPARISON:  02/03/2015 head CT. FINDINGS: No evidence of parenchymal hemorrhage or extra-axial fluid collection. There are stable mild calcifications in the bilateral globus pallidus. No mass lesion, mass effect, or midline shift. No CT evidence of acute infarction. There is intracranial atherosclerotic arterial calcification. Nonspecific stable mild-to-moderate subcortical and periventricular white matter hypodensity, most in  keeping with chronic small vessel ischemic change. There is stable diffuse mild cerebral volume loss. No ventriculomegaly. The visualized paranasal sinuses are essentially clear. The mastoid air cells are unopacified. No evidence of calvarial fracture. IMPRESSION: 1. No acute intracranial abnormality. 2. Intracranial atherosclerosis, mild diffuse cerebral volume loss and nonspecific chronic small vessel ischemic white matter change, stable. Electronically Signed   By: Delbert PhenixJason A Poff M.D.   On: 03/10/2015 20:13   [place] Abdomen Limited  03/11/2015  CLINICAL DATA:  81 year old male with abdominal distention, query ascites. Initial encounter. EXAM: LIMITED ABDOMEN ULTRASOUND FOR ASCITES TECHNIQUE: Limited ultrasound survey for ascites was performed in all four abdominal quadrants. COMPARISON:  [facility]guided paracentesis 02/07/2015 FINDINGS: Simple appearing ascites fluid visible in all 4 quadrants of the abdomen. Overall volume is moderate to large. IMPRESSION: Moderate to large volume of ascites throughout the abdomen. Electronically Signed   By: Odessa FlemingH  Hall M.D.   On: 03/11/2015 00:08   Dg Chest Port 1 View  03/10/2015  CLINICAL DATA:  History per patient's family with reported altered mental status and chest pain. History of dementia and atrial fibrillation. EXAM: PORTABLE CHEST 1 VIEW COMPARISON:  02/03/2015 FINDINGS: Cardiac silhouette is mildly enlarged. No mediastinal or hilar masses or evidence of adenopathy. Mild basilar opacity is noted similar to the prior study, likely atelectasis. No convincing pneumonia and no pulmonary edema. No pneumothorax. Bony thorax is intact. IMPRESSION: No acute cardiopulmonary disease. Electronically Signed   By: Amie Portlandavid  Ormond M.D.   On: 03/10/2015 20:20   Medications: I have reviewed the patient's current medications. Scheduled Meds: . allopurinol  100 mg Oral Daily  . buPROPion  150 mg Oral BID  . digoxin  0.125 mg Oral Daily   . folic acid  400 mcg Oral Daily  . furosemide  40 mg Intravenous BID  . lactose free nutrition  237 mL Oral BID BM  . lactulose  30 g Oral TID  . lisinopril  20 mg Oral Daily  . metoprolol succinate  50 mg Oral Daily  . pantoprazole (PROTONIX) IV  40 mg Intravenous Q12H  . potassium chloride  60 mEq Oral BID  . risperiDONE  0.5 mg Oral Once  .  spironolactone  100 mg Oral Daily  . tamsulosin  0.4 mg Oral Daily   Continuous Infusions:  PRN Meds:.haloperidol lactate Assessment/Plan: Principal Problem:   Encephalopathy Active Problems:   Atrial fibrillation (HCC)   HTN (hypertension)   Hyperlipidemia   Hepatic cirrhosis (HCC)   High anion gap metabolic acidosis   Lactic acidosis   Protein-calorie malnutrition, severe   Ascites   81 yo man with history of alcoholic cirrhosis with MELD of 22 presenting with progressive confusion over the last month (last 6 months per wife), and some aabdominal distension, constipation and melena.  Acute on chronic confusion in the setting of alcoholic cirrhosis and dementia: Confusion seems to be chronic in nature, as described in yesterday's progress note- pl refer to that.  Digoxin level is normal.  -Lactulose 30 mg bid. -protonix 40 mg bid -Ordered ultrasound guided paracentesis with labs including gram stain -liver ultrasound pending -FOBT ordered -will obtain records from GI and PCP, tried yesterday but they did not send it yet -SLp recommended dysphagia diet 3 due to inability to initiate swallow but intact esophageal swallowing -haldol PRN for agitation -spironolactone 100 mg  Hepatic cirrhosis secondary to alcoholic cirrhosis: has MELD score of 21-22 -Order liver ultrasound -ordered US para -IV lasix -Hep C nonreactive, ordered Hep B -Ferritin > 200, ordered transferrin (to rule out hemachromatosis ) -Echo showed EF of 65-70%  -continue lisinopril, lasix, digoxin,spironolactone   Hypokalemia: of 2.7 today -repleted with Kdur  60 bid  Afib: rate in the 80s -restart xarelto if FOBT negative -metoprolol xl 50 mg  Anemia: Ferritin markedly high and high retic count. Hgb of 10.8, but last month it was 10.7. Most likely his ferritin is high due to it being acute phase reactant. Unlikely to be hemachromatosis but have ordered transferrin.  -Hgb at 9- stable -do not suspect acute bleed -ordered FOBT -ordered transferrin -tranfuse if hgb <7  Dispo: Disposition is deferred at this time, awaiting improvement of current medical problems.  Anticipated discharge in approximately 2 day(s).   The patient does have a current PCP Kandyce Rud, MD) and does need an [facility] hospital follow-up appointment after discharge.  The patient does not have transportation limitations that hinder transportation to clinic appointments.  .Services Needed at time of discharge: Y = Yes, Blank = No PT:   OT:   RN:   Equipment:   Other:     LOS: 2 days   Deneise Lever, MD 03/12/2015, 10:56 AM

## 2015-03-12 NOTE — Care Management Note (Addendum)
Case Management Note  Patient Details  Name: Mark HarmanRobert L Schiraldi Sr. MRN: [id number] Date of Birth: [birth year]  Subjective/Objective:                 Patient from home with wife. [facility] Resume order placed by CM.    Action/Plan:  [facility] referral for [facility]umption of services when offices open tomorrow. 03-13-15 [facility]   Expected Discharge Date:                  Expected Discharge Plan:  Home w Home Health Services  In-House Referral:     Discharge planning Services  CM Consult  Post Acute Care Choice:  Home Health Choice offered to:  Patient  DME Arranged:    DME Agency:  [facility] (resume services)  HH Arranged:    HH Agency:     Status of Service:  Completed, signed off  Medicare Important Message Given:    Date Medicare IM Given:    Medicare IM give by:    Date Additional Medicare IM Given:    Additional Medicare Important Message give by:     If discussed at Long Length of Stay Meetings, dates discussed:    Additional Comments:  Lawerance SabalDebbie Arnella Pralle, RN 03/12/2015, 5:47 PM

## 2015-03-12 NOTE — Progress Notes (Signed)
Speech Language Pathology Treatment: Dysphagia  Patient Details Name: Mark HarmanRobert L Fanfan Sr. MRN: [id number] DOB: 04/20/1934 Today's Date: 03/12/2015 Time: [id number] SLP Time Calculation (min) (ACUTE ONLY): 13 min  Assessment / Plan / Recommendation Clinical Impression  Pt appears less confused today, with resultant improvement in oral swallow function. He consumed self-fed trials of regular textures and thin liquids without cueing needed. Educated pt and spouse about the fluctuations that can occur with a cognitively-based dysphagia. Also encouraged self-feeding as much as possible with rationale provided. Would continue with softer solids for now.   HPI Other Pertinent Information: Mark Stephens is a 81 year old man with history of alcoholic cirrhosis, atrial fibrillation, and hypertension presenting with progressive confusion and difficulty swallowing.   Pertinent Vitals Pain Assessment: No/denies pain  SLP Plan  Continue with current plan of care    Recommendations Diet recommendations: Dysphagia 3 (mechanical soft);Thin liquid Liquids provided via: Cup;Straw Medication Administration: Whole meds with puree Supervision: Patient able to self feed;Full supervision/cueing for compensatory strategies Compensations: Minimize environmental distractions;Slow rate;Small sips/bites;Follow solids with liquid Postural Changes and/or Swallow Maneuvers: Seated upright 90 degrees       Oral Care Recommendations: Oral care BID Follow up Recommendations: Home health SLP Plan: Continue with current plan of care    Mark Stephens, M.A. CCC-SLP [phone]  Mark Hamaiewonsky, [name] 03/12/2015, 11:42 AM

## 2015-03-12 NOTE — Progress Notes (Signed)
[facility]  Patient Status: Active (receiving services up to time of hospitalization)  [facility] is providing the following services: PT and OT  If patient discharges after hours, please call [phone].   Mark DadaMiranda Stephens 03/12/2015, 2:44 PM

## 2015-03-12 NOTE — Progress Notes (Addendum)
  Date: 03/12/2015  Patient name: Arlyce HarmanRobert L Donatelli Sr.  Medical record number: [id number]  Date of birth: [birth year]   This patient's plan of care was discussed with the house staff. Please see Dr. Bo MerinoSaraiya's note for complete details. I concur with his findings.  Mr Marcelle OverlieHolland is improved both objectively and subjectively today.  AST/ALT have returned to normal, Tbili is lower.  Jaundice is improved.  He does continue to have 4+ pitting edema to legs and ascites.  Will order ultrasound guided paracentesis today for diagnostics and therapeutic fluid removal.  I do not have a high suspicion that he has SBP, will check gram stain when available.  He is having bowel movements with the lactulose, which is likely the cause for his improved mental status.  He was oriented to person and place today, not year or president.  MELD score is 20, 6% mortality at 3 months.  His Hgb is stable, FOBT done today and is pending.  If GIB is still a concern today, will consult GI. Pending records from outside specialists in [place].    Inez CatalinaEmily B Binh Doten, MD 03/12/2015, 11:25 AM

## 2015-03-12 NOTE — Procedures (Signed)
   US guided RLQ paracentesis 1.6 Liters yellow fluid Sent for labs per MD  BP down to 86/56 Although asymptomatic Discontinued para

## 2015-03-13 DIAGNOSIS — K7460 Unspecified cirrhosis of liver: Secondary | ICD-10-CM | POA: Insufficient documentation

## 2015-03-13 LAB — HEPATITIS B SURFACE ANTIGEN: Hepatitis B Surface Ag: NEGATIVE

## 2015-03-13 LAB — COMPREHENSIVE METABOLIC PANEL
ALT: 15 U/L — AB (ref 17–63)
AST: 32 U/L (ref 15–41)
Albumin: 1.9 g/dL — ABNORMAL LOW (ref 3.5–5.0)
Alkaline Phosphatase: 70 U/L (ref 38–126)
Anion gap: 8 (ref 5–15)
BILIRUBIN TOTAL: 2.3 mg/dL — AB (ref 0.3–1.2)
BUN: 6 mg/dL (ref 6–20)
CO2: 29 mmol/L (ref 22–32)
CREATININE: 0.86 mg/dL (ref 0.61–1.24)
Calcium: 8.5 mg/dL — ABNORMAL LOW (ref 8.9–10.3)
Chloride: 99 mmol/L — ABNORMAL LOW (ref 101–111)
GFR calc Af Amer: >60 mL/min (ref >60)
Glucose, Bld: 119 mg/dL — ABNORMAL HIGH (ref 65–99)
Potassium: 4.1 mmol/L (ref 3.5–5.1)
Sodium: 136 mmol/L (ref 135–145)
TOTAL PROTEIN: 5.7 g/dL — AB (ref 6.5–8.1)

## 2015-03-13 LAB — PH, BODY FLUID: pH, Body Fluid: 7.7

## 2015-03-13 LAB — AMYLASE, PERITONEAL FLUID: Amylase, peritoneal fluid: 16 U/L

## 2015-03-13 MED ORDER — RIVAROXABAN 20 MG PO TABS
20.0000 mg | ORAL_TABLET | Freq: Every day | ORAL | Status: DC
  Administered 2015-03-13: 20 mg via ORAL
  Filled 2015-03-13: qty 1

## 2015-03-13 MED ORDER — FUROSEMIDE 40 MG PO TABS
40.0000 mg | ORAL_TABLET | Freq: Every day | ORAL | Status: DC
  Administered 2015-03-13 – 2015-03-14 (×2): 40 mg via ORAL
  Filled 2015-03-13 (×2): qty 1

## 2015-03-13 MED ORDER — SPIRONOLACTONE 25 MG PO TABS
50.0000 mg | ORAL_TABLET | Freq: Every day | ORAL | Status: DC
  Administered 2015-03-13 – 2015-03-14 (×2): 50 mg via ORAL
  Filled 2015-03-13 (×2): qty 2

## 2015-03-13 NOTE — Care Management Important Message (Signed)
Important Message  Patient Details  Name: Mark HarmanRobert L Aiken Sr. MRN: [id number] Date of Birth: November 02, 1933   Medicare Important Message Given:  Yes-second notification given    Kyla BalzarineShealy, Kadi Hession Abena 03/13/2015, 11:02 AM

## 2015-03-13 NOTE — Progress Notes (Signed)
Subjective:  Patient seen and examined at bedside. He had paracentesis done yesterday and 1.5 L of fluid was removed and sent for analysis. He had some hypotension so some of his BP meds were held He is feeling well, this morning he is alert to person, place, year and the president.  Wife says he looks much better and was more lively and conversant.  Refused lactulose in AM   Objective: Vital signs in last 24 hours: Filed Vitals:   03/12/15 2111 03/12/15 2114 03/13/15 0500 03/13/15 0600  BP: 91/40 93/40  85/50  Pulse: 65 57  64  Temp: 98.8 F (37.1 C)   98.4 F (36.9 C)  TempSrc: Oral   Oral  Resp: 18   20  Height:      Weight:   145 lb (65.772 kg)   SpO2: 97% 98%  99%   Weight change: -1 lb (-0.454 kg)  Intake/Output Summary (Last 24 hours) at 03/13/15 1144 Last data filed at 03/13/15 0953  Gross per 24 hour  Intake    236 ml  Output    800 ml  Net   -564 ml   General: Pt lying in bed, alert to name and place, but not time HEENT: has scleral icterus, improved from yesterday Cardiovascular: normal rate, regular rhythm  Pulmonary/Chest: Clear to auscultation bilaterally, no wheezes, rales, or rhonchi. Abdominal: Soft, nontender and nondistended, has a tape from para site  Extremities: 2+ pitting edema in LE , pulses symmetric and intact bilaterally., the LE edema has improved from yeterday Skin: Warm, dry and intact. No rashes or erythema, no asterixis   Lab Results: Results for orders placed or performed during the hospital encounter of 03/10/15 (from the past 24 hour(s))  Occult blood card to lab, stool RN will collect     Status: None   Collection Time: 03/12/15 12:21 PM  Result Value Ref Range   Fecal Occult Bld NEGATIVE NEGATIVE  Albumin, pleural or peritoneal fluid     Status: None   Collection Time: 03/12/15  1:47 PM  Result Value Ref Range   Albumin, Fluid <1.0 g/dL   Fluid Type-FALB ASCITIC   Lactate dehydrogenase (CSF, pleural or peritoneal fluid)      Status: Abnormal   Collection Time: 03/12/15  1:47 PM  Result Value Ref Range   LD, Fluid 42 (H) 3 - 23 U/L   Fluid Type-FLDH ASCITIC   Protein, pleural or peritoneal fluid     Status: None   Collection Time: 03/12/15  1:47 PM  Result Value Ref Range   Total protein, fluid <3.0 g/dL   Fluid Type-FTP ASCITIC   Body fluid cell count with differential     Status: Abnormal   Collection Time: 03/12/15  1:47 PM  Result Value Ref Range   Fluid Type-FCT ASCITIC    Color, Fluid YELLOW YELLOW   Appearance, Fluid HAZY (A) CLEAR   WBC, Fluid 137 0 - 1000 cu mm   Neutrophil Count, Fluid 21 0 - 25 %   Lymphs, Fluid 37 %   Monocyte-Macrophage-Serous Fluid 42 (L) 50 - 90 %   Eos, Fluid 0 %  Glucose, peritoneal fluid     Status: None   Collection Time: 03/12/15  1:47 PM  Result Value Ref Range   Glucose, Peritoneal Fluid 135 mg/dL  Amylase, Peritoneal Fluid     Status: None   Collection Time: 03/12/15  1:47 PM  Result Value Ref Range   Amylase, peritoneal fluid 16 U/L  PH, Body Fluid     Status: None   Collection Time: 03/12/15  1:47 PM  Result Value Ref Range   pH, Body Fluid 7.7 Not Estab.   Source of Sample ASCITIC   Gram stain     Status: None   Collection Time: 03/12/15  1:47 PM  Result Value Ref Range   Specimen Description FLUID ABDOMEN    Special Requests NONE    Gram Stain      MODERATE WBC PRESENT,BOTH PMN AND MONONUCLEAR NO ORGANISMS SEEN    Report Status 03/12/2015 FINAL   Aerobic culture *Canceled*     Status: None ()   Collection Time: 03/12/15  1:52 PM   Narrative   LIS Cancel (ORR/DE = Data Error)  Comprehensive metabolic panel     Status: Abnormal   Collection Time: 03/13/15  6:55 AM  Result Value Ref Range   Sodium 136 135 - 145 mmol/L   Potassium 4.1 3.5 - 5.1 mmol/L   Chloride 99 (L) 101 - 111 mmol/L   CO2 29 22 - 32 mmol/L   Glucose, Bld 119 (H) 65 - 99 mg/dL   BUN 6 6 - 20 mg/dL   Creatinine, Ser 4.09 0.61 - 1.24 mg/dL   Calcium 8.5 (L) 8.9 - 10.3 mg/dL    Total Protein 5.7 (L) 6.5 - 8.1 g/dL   Albumin 1.9 (L) 3.5 - 5.0 g/dL   AST 32 15 - 41 U/L   ALT 15 (L) 17 - 63 U/L   Alkaline Phosphatase 70 38 - 126 U/L   Total Bilirubin 2.3 (H) 0.3 - 1.2 mg/dL   GFR calc non Af Amer >60 >60 mL/min   GFR calc Af Amer >60 >60 mL/min   Anion gap 8 5 - 15    Micro Results: Recent Results (from the past 240 hour(s))  Gram stain     Status: None   Collection Time: 03/12/15  1:47 PM  Result Value Ref Range Status   Specimen Description FLUID ABDOMEN  Final   Special Requests NONE  Final   Gram Stain   Final    MODERATE WBC PRESENT,BOTH PMN AND MONONUCLEAR NO ORGANISMS SEEN    Report Status 03/12/2015 FINAL  Final   Studies/Results: US Paracentesis  03/12/2015  INDICATION: ascites EXAM: ULTRASOUND-GUIDED PARACENTESIS COMPARISON:  Previous paracentesis MEDICATIONS: 10 cc 1% lidocaine COMPLICATIONS: None immediate TECHNIQUE: Informed written consent was obtained from the patient after a discussion of the risks, benefits and alternatives to treatment. A timeout was performed prior to the initiation of the procedure. Initial ultrasound scanning demonstrates a large amount of ascites within the right lower abdominal quadrant. The right lower abdomen was prepped and draped in the usual sterile fashion. 1% lidocaine with epinephrine was used for local anesthesia. Under direct ultrasound guidance, a 19 gauge, 7-cm, Yueh catheter was introduced. An ultrasound image was saved for documentation purposed.The paracentesis was performed. The catheter was removed and a dressing was applied. The patient tolerated the procedure well without immediate post procedural complication. FINDINGS: A total of approximately 1.6 liters of yellow fluid was removed. Samples were sent to the laboratory as requested by the clinical team. IMPRESSION: Successful ultrasound-guided paracentesis yielding 1.6 liters of peritoneal fluid. Discontinued paracentesis---pt with BP 86/56; although  asymptomatic, BP continued to decrease Read by: Robet Leu [facility] Electronically Signed   By: Jolaine Click M.D.   On: 03/12/2015 14:55   Medications: I have reviewed the patient's current medications. Scheduled Meds: . allopurinol  100 mg  Oral Daily  . buPROPion  150 mg Oral BID  . digoxin  0.125 mg Oral Daily  . folic acid  400 mcg Oral Daily  . furosemide  40 mg Intravenous Daily  . lactose free nutrition  237 mL Oral BID BM  . lactulose  30 g Oral BID  . pantoprazole (PROTONIX) IV  40 mg Intravenous Q12H  . risperiDONE  0.5 mg Oral Once  . spironolactone  50 mg Oral Daily  . tamsulosin  0.4 mg Oral Daily   Continuous Infusions:  PRN Meds:.haloperidol lactate Assessment/Plan:  81 yo man with history of alcoholic cirrhosis with MELD of 22 presenting with progressive confusion over the last month (last 6 months per wife), and some aabdominal distension, constipation and melena.  Hypotension: likely due to rapid removal of fluid -BP of 93/50 -Decreased spironolactone to 50, transition to oral lasix  Acute on chronic confusion in the setting of alcoholic cirrhosis and dementia:  -Lactulose 30 mg bid. -protonix 40 mg bid -liver ultrasound pending -FOBT negative - Tried three times to obtain records from both PCP and the GI office, they have not sent the records yet despite faxing the release of records form. -SLp recommended dysphagia diet 3 due to inability to initiate swallow but intact esophageal swallowing -haldol PRN for agitation -spironolactone 50 mg  Unlikely to be SBP- completed ultrasound guided paracentesis with labs including gram stain SAAG gradient is 1. (serum minus ascitis albumin)- so it is equivocal  Gram stain shows moderate WBC, Cell count of 137, (so it is <250) Albumin <1 LDH of 42 Total protein <3 Glucose 135  Hepatic cirrhosis secondary to alcoholic cirrhosis: has MELD score of 21-22 -Order liver ultrasound -para completed yesterday -oral  lasix -Hep C nonreactive, Hep B surface antigen negative -Ferritin > 200, transferrin WNL, so unlikely to be hemachromatosis -Echo showed EF of 65-70%  -continue lisinopril, lasix, digoxin,spironolactone    Afib: rate in the 80s -restarted xarelto as FOBT negative -metoprolol xl 50 mg  Anemia: Ferritin markedly high and high retic count. Hgb of 10.8, but last month it was 10.7. Most likely his ferritin is high due to it being acute phase reactant. Unlikely to be hemachromatosis -Hgb at 9- stable -do not suspect acute bleed -ordered FOBT -ordered transferrin -tranfuse if hgb <7   Dispo: Disposition is deferred at this time, awaiting improvement of current medical problems.  Anticipated discharge in approximately 1 day(s).   The patient does have a current PCP Kandyce Rud, MD) and does need an [facility] hospital follow-up appointment after discharge.  The patient does not have transportation limitations that hinder transportation to clinic appointments.  .Services Needed at time of discharge: Y = Yes, Blank = No PT:   OT:   RN:   Equipment:   Other:     LOS: 3 days   Deneise Lever, MD 03/13/2015, 11:44 AM

## 2015-03-13 NOTE — Progress Notes (Signed)
  Date: 03/13/2015  Patient name: Mark HarmanRobert L Cocuzza Sr.  Medical record number: [id number]  Date of birth: April 13, 1934   This patient's plan of care was discussed with the house staff. Please Dr. Bo MerinoSaraiya's their note for complete details. I concur with his findings.  Mr. Marcelle OverlieHolland is improved today.  He had a paracentesis yesterday which did not show any bacteria on the gram stain.  His MS is significantly improved with lactulose alone, so I doubt SBP.  His blood pressure has been a little low.  We are decreasing his diuretics today and monitoring for stool production with lactulose.  Goal would be 2-3 BM per day on discharge home.  Possible discharge tomorrow if he continues to do well.    Inez CatalinaEmily B Mullen, MD 03/13/2015, 2:23 PM

## 2015-03-14 LAB — COMPREHENSIVE METABOLIC PANEL WITH GFR
ALT: 14 U/L — ABNORMAL LOW (ref 17–63)
AST: 28 U/L (ref 15–41)
Albumin: 2 g/dL — ABNORMAL LOW (ref 3.5–5.0)
Alkaline Phosphatase: 75 U/L (ref 38–126)
Anion gap: 7 (ref 5–15)
BUN: 8 mg/dL (ref 6–20)
CO2: 28 mmol/L (ref 22–32)
Calcium: 8 mg/dL — ABNORMAL LOW (ref 8.9–10.3)
Chloride: 97 mmol/L — ABNORMAL LOW (ref 101–111)
Creatinine, Ser: 0.78 mg/dL (ref 0.61–1.24)
GFR calc Af Amer: >60 mL/min
GFR calc non Af Amer: >60 mL/min
Glucose, Bld: 91 mg/dL (ref 65–99)
Potassium: 3.7 mmol/L (ref 3.5–5.1)
Sodium: 132 mmol/L — ABNORMAL LOW (ref 135–145)
Total Bilirubin: 2.2 mg/dL — ABNORMAL HIGH (ref 0.3–1.2)
Total Protein: 5.1 g/dL — ABNORMAL LOW (ref 6.5–8.1)

## 2015-03-14 MED ORDER — LACTULOSE 10 GM/15ML PO SOLN: 30.0000 g | Freq: Two times a day (BID) | ORAL | Status: DC

## 2015-03-14 NOTE — Discharge Summary (Signed)
Name: Mark HarmanRobert L Jeudy Sr. MRN: [id number] DOB: [birth year] 81 y.o. PCP: Kandyce RudMarcus Babaoff, MD  Date of Admission: 03/10/2015  7:01 PM Date of Discharge: 03/14/2015 Attending Physician: Inez CatalinaEmily B Mullen, MD  Discharge Diagnosis: 1. Acute Encephalopathy on top of alcoholic cirrhosis and baseline chronic dementia Principal Problem:   Encephalopathy Active Problems:   Atrial fibrillation (HCC)   HTN (hypertension)   Hyperlipidemia   Hepatic cirrhosis (HCC)   High anion gap metabolic acidosis   Lactic acidosis   Protein-calorie malnutrition, severe   Ascites   Cirrhosis (HCC)  Discharge Medications:   Medication List    STOP taking these medications        lisinopril 20 MG tablet  Commonly known as:  PRINIVIL,ZESTRIL      TAKE these medications        allopurinol 100 MG tablet  Commonly known as:  ZYLOPRIM  Take 100 mg by mouth daily.     digoxin 0.25 MG tablet  Commonly known as:  LANOXIN  TAKE ONE-HALF TO ONE TABLET BY MOUTH ONCE DAILY     FLOMAX 0.4 MG Caps capsule  Generic drug:  tamsulosin  Take 0.4 mg by mouth daily.     folic acid 400 MCG tablet  Commonly known as:  FOLVITE  Take 400 mcg by mouth daily.     furosemide 20 MG tablet  Commonly known as:  LASIX  Take 40 mg by mouth daily.     lactulose 10 GM/15ML solution  Commonly known as:  CHRONULAC  Take 45 mLs (30 g total) by mouth 2 (two) times daily.     metoprolol succinate 50 MG 24 hr tablet  Commonly known as:  TOPROL-XL  Take 50 mg by mouth daily.     rivaroxaban 20 MG Tabs tablet  Commonly known as:  XARELTO  Take 1 tablet (20 mg total) by mouth daily.     spironolactone 25 MG tablet  Commonly known as:  ALDACTONE  Take 25 mg by mouth daily.        Disposition and follow-up:   MarkMark L Leventhal Sr. [facility].  At the hospital follow up visit please address:  1.  Recheck BP- Lisinopril was held due to asymptomatic  hypotension Mental status: please do MMSE or Mini-cog- lewy body dementia? Lactulose: Is he taking it and how many BM is he having?  2.  Labs / imaging needed at time of follow-up: CMET  3.  Pending labs/ test needing follow-up:   Follow-up Appointments:     Follow-up Information    Follow up with [facility].   Why:  resume services.   Contact information:   [address] [place] [address] [phone]       Follow up with Rich Numberivet, Carly, MD On 03/21/2015.   Specialty:  Internal Medicine   Why:  3:45 for hospital follow up- call to reschedule if this is not convenient   Contact information:   [address] [place] [address] [phone]       Discharge Instructions: Discharge Instructions    Diet - low sodium heart healthy    Complete by:  As directed      Discharge instructions    Complete by:  As directed   Please use the lactulose twice a day- and titrate to 3 bowel movements per day- if you are having less than three, then use it 3 times a day. If you  are having a lot of bowel movements, then cut the lactulose to once a day. This is to get your ammonia level down.   We have made you a follow up appointment in our clinic in 1st floor of this hospital (Internal Medicine Clinic)- you may reschedule with the date you find more convenient  We have also arranged for home health aide to come to your house.   Please hold the lisinopril for now, as your blood pressures have been on the low side, until you see a doctor in outpatient.     Increase activity slowly    Complete by:  As directed            Consultations:    Procedures Performed:  Ct Head Wo Contrast  03/10/2015  CLINICAL DATA:  Altered mental status. EXAM: CT HEAD WITHOUT CONTRAST TECHNIQUE: Contiguous axial images were obtained from the base of the skull through the vertex without intravenous contrast. COMPARISON:  02/03/2015 head CT. FINDINGS: No evidence of parenchymal  hemorrhage or extra-axial fluid collection. There are stable mild calcifications in the bilateral globus pallidus. No mass lesion, mass effect, or midline shift. No CT evidence of acute infarction. There is intracranial atherosclerotic arterial calcification. Nonspecific stable mild-to-moderate subcortical and periventricular white matter hypodensity, most in keeping with chronic small vessel ischemic change. There is stable diffuse mild cerebral volume loss. No ventriculomegaly. The visualized paranasal sinuses are essentially clear. The mastoid air cells are unopacified. No evidence of calvarial fracture. IMPRESSION: 1. No acute intracranial abnormality. 2. Intracranial atherosclerosis, mild diffuse cerebral volume loss and nonspecific chronic small vessel ischemic white matter change, stable. Electronically Signed   By: Delbert Phenix M.D.   On: 03/10/2015 20:13   US Abdomen Limited  03/11/2015  CLINICAL DATA:  81 year old male with abdominal distention, query ascites. Initial encounter. EXAM: LIMITED ABDOMEN ULTRASOUND FOR ASCITES TECHNIQUE: Limited ultrasound survey for ascites was performed in all four abdominal quadrants. COMPARISON:  Images from [facility] ultrasound-guided paracentesis 02/07/2015 FINDINGS: Simple appearing ascites fluid visible in all 4 quadrants of the abdomen. Overall volume is moderate to large. IMPRESSION: Moderate to large volume of ascites throughout the abdomen. Electronically Signed   By: Odessa Fleming M.D.   On: 03/11/2015 00:08   US Paracentesis  03/12/2015  INDICATION: ascites EXAM: ULTRASOUND-GUIDED PARACENTESIS COMPARISON:  Previous paracentesis MEDICATIONS: 10 cc 1% lidocaine COMPLICATIONS: None immediate TECHNIQUE: Informed written consent was obtained from the patient after a discussion of the risks, benefits and alternatives to treatment. A timeout was performed prior to the initiation of the procedure. Initial ultrasound scanning demonstrates a large  amount of ascites within the right lower abdominal quadrant. The right lower abdomen was prepped and draped in the usual sterile fashion. 1% lidocaine with epinephrine was used for local anesthesia. Under direct ultrasound guidance, a 19 gauge, 7-cm, Yueh catheter was introduced. An ultrasound image was saved for documentation purposed.The paracentesis was performed. The catheter was removed and a dressing was applied. The patient tolerated the procedure well without immediate post procedural complication. FINDINGS: A total of approximately 1.6 liters of yellow fluid was removed. Samples were sent to the laboratory as requested by the clinical team. IMPRESSION: Successful ultrasound-guided paracentesis yielding 1.6 liters of peritoneal fluid. Discontinued paracentesis---pt with BP 86/56; although asymptomatic, BP continued to decrease Read by: Robet Leu [facility] Electronically Signed   By: Jolaine Click M.D.   On: 03/12/2015 14:55   Dg Chest Port 1 View  03/10/2015  CLINICAL DATA:  History per patient's family with reported altered mental status and chest pain. History of dementia and atrial fibrillation. EXAM: PORTABLE CHEST 1 VIEW COMPARISON:  02/03/2015 FINDINGS: Cardiac silhouette is mildly enlarged. No mediastinal or hilar masses or evidence of adenopathy. Mild basilar opacity is noted similar to the prior study, likely atelectasis. No convincing pneumonia and no pulmonary edema. No pneumothorax. Bony thorax is intact. IMPRESSION: No acute cardiopulmonary disease. Electronically Signed   By: Amie Portland M.D.   On: 03/10/2015 20:20    2D Echo: performed  Cardiac Cath:   Admission HPI:  Mark Stephens is a 14 year old man with history of alcoholic cirrhosis, atrial fibrillation, and hypertension presenting with progressive confusion and difficulty swallowing over the last two months. His wife provided the history as the patient was oriented only to person, confabulating, and clearly confused. Over the  last two months, he has been more confused, sometimes hallucinating and seeing "markers" on the wall. During this time, his abdomen has gotten more distended since a paracentesis one month ago when they pulled off 3 liters of fluid, and his legs have been swelling quite a bite. He's also been constipated, and he has never tried taking lactulose or rifaximin. The wife also reports that his stools have been "jet black," which she attributed to all of the grape juice he drinks, which he thinks is wine (the patient quit drinking alcohol one year ago). He is followed by gastroenterology and underwent endoscopy one month ago that showed hypertensive gastropathy but no varices. His hemoglobin was 13.5 back in June but was 10.6 this admission. Regarding his lower legs welling, the wife says he sleeps flat on his back with a problem and is not short of breath. She says he had an echocardiogram back in 2011 but has not had one since then. He is currently taking digoxin, lisinopril, metoprolol, spironolactone, and furosemide, and has been compliant with these medications. He has been complaining of "numb hands" but denies any changes in his vision, nausea, vomiting, or diarrhea.  In the emergency department, his vital signs were normal. Labs were notable for hyponatremia of 130, metabolic acidosis with a bicarb of 19 and anion gap of 17, AST 52 and ALT 18, total bilirubin of 3.4, ammonia 55, hemoglobin 10.8, platelet 259, INR 1.67, normal urinalysis, unremarkable chest x-ray, unremarkable CT head, and EKG showing atrial fibrillation without ischemic changes  Hospital Course by problem list:   Acute Encephalopathy on Chronic dementia: Upon admission, patient was markedly confabulating, and agitated and only oriented to name. Required haldol one night. Wife mentioned this was a progressive decline, esp since December and said he has baseline dementia with a strong family history of dementia. She was worried about lewy body  dementia. He presented with worsening liver disease and increased Tbili, but he did not have asterixis and global confusion. So, most likely this admission was acute hepatic encephalopathy on top of his chronic dementia.  He has had 2 prior paracentesis done with a history of SBP treated with cipro, so SBP was in the differential. So for that reason, ultrasound guided paracentesis was performed by IR. It ruled out SBP and the cell count was <200. His mental status improved well with the lactulose and his Tbili trended down and ammonia normalized. AST and ALT returned to normal. He had on average 3-4 BM per day with the lactulose. Spironolactone was resumed. Hepatitis panel negative.  Hepatic cirrhosis 2/2 to EtOH and volume overload: As ferritin was markedly high,  transferrin was ordered and was normal. This likely ruled out hemachromatosis. He initially presented with lactic acidosis but it resolved.  Hepatitis panel was normal. Meld score stayed from 19-21 throughout admission and on the day of discharge.   Dysphagia: He exhibited improvement in swallowing and his diet was successfully advanced.  Hypotension: He had few episodes of hypotension after the paracentesis, so some of the blood pressure medications were held, and will be resumed on the follow up appointment.   Hypokalemia: had an episode of K being 2.7. His potassium normalized after receiving Kdur.  Atrial fibrillation: Xarelto was initially held, but his Hgb remained stable and FOBT negative so it was resumed. HR remained stable. 2d echo was unremarkable.  Discharge Vitals:   BP 117/72 mmHg  Pulse 96  Temp(Src) 97.8 F (36.6 C) (Oral)  Resp 18  Ht 5\' 11"  (1.803 m)  Wt 146 lb 4.8 oz (66.361 kg)  BMI 20.41 kg/m2  SpO2 99%  Discharge Labs:  Results for orders placed or performed during the hospital encounter of 03/10/15 (from the past 24 hour(s))  Comprehensive metabolic panel     Status: Abnormal   Collection Time: 03/14/15   4:59 AM  Result Value Ref Range   Sodium 132 (L) 135 - 145 mmol/L   Potassium 3.7 3.5 - 5.1 mmol/L   Chloride 97 (L) 101 - 111 mmol/L   CO2 28 22 - 32 mmol/L   Glucose, Bld 91 65 - 99 mg/dL   BUN 8 6 - 20 mg/dL   Creatinine, Ser 4.09 0.61 - 1.24 mg/dL   Calcium 8.0 (L) 8.9 - 10.3 mg/dL   Total Protein 5.1 (L) 6.5 - 8.1 g/dL   Albumin 2.0 (L) 3.5 - 5.0 g/dL   AST 28 15 - 41 U/L   ALT 14 (L) 17 - 63 U/L   Alkaline Phosphatase 75 38 - 126 U/L   Total Bilirubin 2.2 (H) 0.3 - 1.2 mg/dL   GFR calc non Af Amer >60 >60 mL/min   GFR calc Af Amer >60 >60 mL/min   Anion gap 7 5 - 15    Signed: Deneise Lever, MD 03/14/2015, 1:23 PM    Services Ordered on Discharge: Equipment Ordered on Discharge:

## 2015-03-14 NOTE — Progress Notes (Signed)
Speech Language Pathology Treatment: Dysphagia  Patient Details Name: Mark Stephens Sr. MRN: [id number] DOB: 28-Sep-1933 Today's Date: 03/14/2015 Time: 2761-4709 SLP Time Calculation (min) (ACUTE ONLY): 10 min  Assessment / Plan / Recommendation Clinical Impression  Pt seen for dysphagia with wife and son present. Pt alert, oral mastication and transit WFL's. No s/s aspiration with thin liquids or solids. Son asked why pt's swallow was initially disordered. SLP educated on relationship between dysphagia and cognitive deficits. Pt's diet upgraded to regular texture and thin liquids per SLP. Plans to discharge home. No ST needed at present.    HPI Other Pertinent Information: Mr. Kienast is a 81 year old man with history of alcoholic cirrhosis, atrial fibrillation, and hypertension presenting with progressive confusion and difficulty swallowing.   Pertinent Vitals Pain Assessment: No/denies pain  SLP Plan  All goals met;Discharge SLP treatment due to (comment)    Recommendations Diet recommendations: Regular;Thin liquid Liquids provided via: Cup;Straw Medication Administration: Whole meds with puree Supervision: Patient able to self feed Compensations: Slow rate;Small sips/bites Postural Changes and/or Swallow Maneuvers: Seated upright 90 degrees              Oral Care Recommendations: Oral care BID Follow up Recommendations: None Plan: All goals met;Discharge SLP treatment due to (comment)    GO     Houston Siren 03/14/2015, 3:01 PM  Orbie Pyo Colvin Caroli.[employer] [phone]

## 2015-03-14 NOTE — Care Management Note (Signed)
Case Management Note  Patient Details  Name: Mark HarmanRobert L Diluzio Sr. MRN: [id number] Date of Birth: 1933/10/19  Subjective/Objective:                 Patient to discharge to home today. [facility].    Action/Plan:   Expected Discharge Date:                  Expected Discharge Plan:  Home w Home Health Services  In-House Referral:     Discharge planning Services  CM Consult  Post Acute Care Choice:  Home Health Choice offered to:  Patient  DME Arranged:    DME Agency:   (resume services)  HH Arranged:  RN, PT, OT, Nurse's Aide HH Agency:  [facility]  Status of Service:  Completed, signed off  Medicare Important Message Given:  Yes-second notification given Date Medicare IM Given:    Medicare IM give by:    Date Additional Medicare IM Given:    Additional Medicare Important Message give by:     If discussed at Long Length of Stay Meetings, dates discussed:    Additional Comments:  Mark SabalDebbie Burr Soffer, RN 03/14/2015, 1:19 PM

## 2015-03-14 NOTE — Progress Notes (Signed)
Subjective:  Patient seen and examined at bedside. He reported no bowel movements overnight. There were no acute events overnight, he feels better and is ready to go home.  Objective: Vital signs in last 24 hours: Filed Vitals:   03/13/15 1210 03/13/15 2121 03/14/15 0419 03/14/15 0507  BP: 93/50 127/62  97/55  Pulse: 85 82  78  Temp: 98.7 F (37.1 C) 97.7 F (36.5 C)  97.9 F (36.6 C)  TempSrc: Oral Oral  Oral  Resp: 22   14  Height:      Weight:   146 lb 4.8 oz (66.361 kg)   SpO2: 97% 96%  95%   Weight change: 1 lb 4.8 oz (0.59 kg)  Intake/Output Summary (Last 24 hours) at 03/14/15 0824 Last data filed at 03/14/15 0228  Gross per 24 hour  Intake    236 ml  Output   1450 ml  Net  -1214 ml   General: Pt lying in bed, alert to name and place,and time HEENT: unremarkable Cardiovascular: normal rate, regular rhythm  Pulmonary/Chest: Clear to auscultation bilaterally, no wheezes, rales, or rhonchi. Abdominal: Soft, nontender and nondistended, has a tape from para site  Extremities: 2+ pitting edema in LE , pulses symmetric and intact bilaterally., the LE edema has improved  Skin: Warm, dry and intact. No rashes or erythema, no asterixis   Lab Results: Results for orders placed or performed during the hospital encounter of 03/10/15 (from the past 24 hour(s))  Comprehensive metabolic panel     Status: Abnormal   Collection Time: 03/14/15  4:59 AM  Result Value Ref Range   Sodium 132 (L) 135 - 145 mmol/L   Potassium 3.7 3.5 - 5.1 mmol/L   Chloride 97 (L) 101 - 111 mmol/L   CO2 28 22 - 32 mmol/L   Glucose, Bld 91 65 - 99 mg/dL   BUN 8 6 - 20 mg/dL   Creatinine, Ser 6.04 0.61 - 1.24 mg/dL   Calcium 8.0 (L) 8.9 - 10.3 mg/dL   Total Protein 5.1 (L) 6.5 - 8.1 g/dL   Albumin 2.0 (L) 3.5 - 5.0 g/dL   AST 28 15 - 41 U/L   ALT 14 (L) 17 - 63 U/L   Alkaline Phosphatase 75 38 - 126 U/L   Total Bilirubin 2.2 (H) 0.3 - 1.2 mg/dL   GFR calc non Af Amer >60 >60 mL/min   GFR  calc Af Amer >60 >60 mL/min   Anion gap 7 5 - 15     Micro Results: Recent Results (from the past 240 hour(s))  Culture, body fluid-bottle     Status: None (Preliminary result)   Collection Time: 03/12/15  1:47 PM  Result Value Ref Range Status   Specimen Description FLUID ABDOMEN  Final   Special Requests NONE  Final   Culture NO GROWTH < 24 HOURS  Final   Report Status PENDING  Incomplete  Gram stain     Status: None   Collection Time: 03/12/15  1:47 PM  Result Value Ref Range Status   Specimen Description FLUID ABDOMEN  Final   Special Requests NONE  Final   Gram Stain   Final    MODERATE WBC PRESENT,BOTH PMN AND MONONUCLEAR NO ORGANISMS SEEN    Report Status 03/12/2015 FINAL  Final   Studies/Results: US Paracentesis  03/12/2015  INDICATION: ascites EXAM: ULTRASOUND-GUIDED PARACENTESIS COMPARISON:  Previous paracentesis MEDICATIONS: 10 cc 1% lidocaine COMPLICATIONS: None immediate TECHNIQUE: Informed written consent was obtained from the patient after  a discussion of the risks, benefits and alternatives to treatment. A timeout was performed prior to the initiation of the procedure. Initial ultrasound scanning demonstrates a large amount of ascites within the right lower abdominal quadrant. The right lower abdomen was prepped and draped in the usual sterile fashion. 1% lidocaine with epinephrine was used for local anesthesia. Under direct ultrasound guidance, a 19 gauge, 7-cm, Yueh catheter was introduced. An ultrasound image was saved for documentation purposed.The paracentesis was performed. The catheter was removed and a dressing was applied. The patient tolerated the procedure well without immediate post procedural complication. FINDINGS: A total of approximately 1.6 liters of yellow fluid was removed. Samples were sent to the laboratory as requested by the clinical team. IMPRESSION: Successful ultrasound-guided paracentesis yielding 1.6 liters of peritoneal fluid. Discontinued  paracentesis---pt with BP 86/56; although asymptomatic, BP continued to decrease Read by: Robet LeuPamela A Turpin [facility] Electronically Signed   By: Jolaine ClickArthur  Hoss M.D.   On: 03/12/2015 14:55   Medications: I have reviewed the patient's current medications. Scheduled Meds: . allopurinol  100 mg Oral Daily  . buPROPion  150 mg Oral BID  . digoxin  0.125 mg Oral Daily  . folic acid  400 mcg Oral Daily  . furosemide  40 mg Oral Daily  . lactose free nutrition  237 mL Oral BID BM  . lactulose  30 g Oral BID  . pantoprazole (PROTONIX) IV  40 mg Intravenous Q12H  . risperiDONE  0.5 mg Oral Once  . rivaroxaban  20 mg Oral Q supper  . spironolactone  50 mg Oral Daily  . tamsulosin  0.4 mg Oral Daily   Continuous Infusions:  PRN Meds:.haloperidol lactate Assessment/Plan:  Hypotension: likely due to paracentesis -BP of 93/50 -Decreased spironolactone to 50, now on oral lasix  Acute on chronic confusion in the setting of alcoholic cirrhosis and dementia: has improved, pt alert to name, time and place and eager to go home -Lactulose 30 mg bid. -SLp recommended dysphagia diet 3 due to inability to initiate swallow but intact esophageal swallowing -spironolactone 50 mg -s/p paracentesis- Unlikely to be SBP- see note from yesterday  Hepatic cirrhosis secondary to alcoholic cirrhosis: has MELD score of 19 today with 6% mortality in next 3 months -oral lasix -continue lisinopril, lasix, digoxin,spironolactone      Dispo: Disposition is deferred at this time, awaiting improvement of current medical problems.  Anticipated discharge in approximately 0 day(s).   The patient does have a current PCP Kandyce Rud(Marcus Babaoff, MD) and [facility].  The patient does not have transportation limitations that hinder transportation to clinic appointments.  .Services Needed at time of discharge: Y = Yes, Blank = No PT:   OT:   RN:   Equipment:   Other:     LOS: 4  days   Deneise LeverParth Panzy Bubeck, MD 03/14/2015, 8:24 AM

## 2015-03-14 NOTE — Discharge Instructions (Addendum)
Please use the lactulose twice a day- and titrate to 3 bowel movements per day- if you are having less than three, then use it 3 times a day. If you are having a lot of bowel movements, then cut the lactulose to once a day. This is to get your ammonia level down.   We have made you a follow up appointment in our clinic in 1st floor of this hospital (Internal Medicine Clinic)- you may reschedule with the date you find more convenient  We have also arranged for home health aide to come to your house.   Please hold the lisinopril for now, as your blood pressures have been on the low side, until you see a doctor in outpatient.  Information on my medicine - XARELTO (Rivaroxaban)  This medication education was reviewed with me or my healthcare representative as part of my discharge preparation.  The pharmacist that spoke with me during my hospital stay was:  Almon HerculesBaird, Blyss Lugar P, [facility]  Why was Xarelto prescribed for you? Xarelto was prescribed for you to reduce the risk of a blood clot forming that can cause a stroke if you have a medical condition called atrial fibrillation (a type of irregular heartbeat).  What do you need to know about xarelto ? Take your Xarelto ONCE DAILY at the same time every day with your evening meal. If you have difficulty swallowing the tablet whole, you may crush it and mix in applesauce just prior to taking your dose.  Take Xarelto exactly as prescribed by your doctor and DO NOT stop taking Xarelto without talking to the doctor who prescribed the medication.  Stopping without other stroke prevention medication to take the place of Xarelto may increase your risk of developing a clot that causes a stroke.  Refill your prescription before you run out.  After discharge, you should have regular check-up appointments with your healthcare provider that is prescribing your Xarelto.  In the future your dose may need to be changed if your kidney function or weight changes by a  significant amount.  What do you do if you miss a dose? If you are taking Xarelto ONCE DAILY and you miss a dose, take it as soon as you remember on the same day then continue your regularly scheduled once daily regimen the next day. Do not take two doses of Xarelto at the same time or on the same day.   Important Safety Information A possible side effect of Xarelto is bleeding. You should call your healthcare provider right away if you experience any of the following: ? Bleeding from an injury or your nose that does not stop. ? Unusual colored urine (red or dark brown) or unusual colored stools (red or black). ? Unusual bruising for unknown reasons. ? A serious fall or if you hit your head (even if there is no bleeding).  Some medicines may interact with Xarelto and might increase your risk of bleeding while on Xarelto. To help avoid this, consult your healthcare provider or pharmacist prior to using any new prescription or non-prescription medications, including herbals, vitamins, non-steroidal anti-inflammatory drugs (NSAIDs) and supplements.  This website has more information on Xarelto: [email or web address].xarelto.com.

## 2015-03-14 NOTE — Progress Notes (Signed)
  Date: 03/14/2015  Patient name: Mark HarmanRobert L Booze Sr.  Medical record number: [id number]  Date of birth: 12-21-1933   This patient's plan of care was discussed with the house staff. Please see Dr. Bo MerinoSaraiya's note for complete details. I concur with his findings.  Mr. Marcelle OverlieHolland will be discharged today.  Lisinopril is held on discharge due to mild, asymptomatic hypotension.    Inez CatalinaEmily B Chinelo Benn, MD 03/14/2015, 4:08 PM

## 2015-03-14 NOTE — Progress Notes (Signed)
PT Cancellation Note  Patient Details Name: Mark HarmanRobert L Kinsel Sr. MRN: [id number] DOB: 1934-03-05   Cancelled Treatment:    Reason Eval/Treat Not Completed: Other (comment); patient prepped for d/c.  Reports HHPT already set up.  No current equipment needs. Screened and signed off.   WYNN,CYNDI 03/14/2015, 3:34 PM Sheran Lawlessyndi Wynn, PT [phone] 03/14/2015

## 2015-03-15 LAB — TOTAL BILIRUBIN, BODY FLUID: Total bilirubin, fluid: 0.5 mg/dL

## 2015-03-17 LAB — OTHER BODY FLUID CHEMISTRY

## 2015-03-17 LAB — CULTURE, BODY FLUID W GRAM STAIN -BOTTLE: Culture: NO GROWTH

## 2015-03-21 ENCOUNTER — Ambulatory Visit (INDEPENDENT_AMBULATORY_CARE_PROVIDER_SITE_OTHER): Payer: Medicare Other | Admitting: Internal Medicine

## 2015-03-21 ENCOUNTER — Encounter: Payer: Self-pay | Admitting: Internal Medicine

## 2015-03-21 VITALS — BP 121/74 | HR 69 | Temp 97.4°F | Ht 71.0 in | Wt 148.3 lb

## 2015-03-21 DIAGNOSIS — K7031 Alcoholic cirrhosis of liver with ascites: Secondary | ICD-10-CM

## 2015-03-21 DIAGNOSIS — Z7901 Long term (current) use of anticoagulants: Secondary | ICD-10-CM | POA: Diagnosis not present

## 2015-03-21 DIAGNOSIS — E43 Unspecified severe protein-calorie malnutrition: Secondary | ICD-10-CM

## 2015-03-21 DIAGNOSIS — K7460 Unspecified cirrhosis of liver: Secondary | ICD-10-CM

## 2015-03-21 DIAGNOSIS — I4891 Unspecified atrial fibrillation: Secondary | ICD-10-CM | POA: Diagnosis not present

## 2015-03-21 DIAGNOSIS — G9340 Encephalopathy, unspecified: Secondary | ICD-10-CM | POA: Diagnosis not present

## 2015-03-21 DIAGNOSIS — R1310 Dysphagia, unspecified: Secondary | ICD-10-CM

## 2015-03-21 DIAGNOSIS — I10 Essential (primary) hypertension: Secondary | ICD-10-CM | POA: Diagnosis present

## 2015-03-21 MED ORDER — LACTULOSE 10 GM/15ML PO SOLN: 30.0000 g | Freq: Two times a day (BID) | ORAL | Status: AC

## 2015-03-21 NOTE — Patient Instructions (Signed)
-   Increase Lactulose to 3 tablespoons twice daily - Want to have at least 3 bowel movements a day - Continue holding Lisinopril - Please call clinic if systolic blood pressures are low (below 90) or if he becomes symptomatic - Follow up in 2 weeks  General Instructions:   Please bring your medicines with you each time you come to clinic.  Medicines may include prescription medications, over-the-counter medications, herbal remedies, eye drops, vitamins, or other pills.   Progress Toward Treatment Goals:  No flowsheet data found.  Self Care Goals & Plans:  Self Care Goal 03/21/2015  Manage my medications take my medicines as prescribed; bring my medications to every visit; refill my medications on time  Eat healthy foods drink diet soda or water instead of juice or soda; eat more vegetables; eat foods that are low in salt; eat baked foods instead of fried foods; eat fruit for snacks and desserts    No flowsheet data found.   Care Management & Community Referrals:  No flowsheet data found.

## 2015-03-23 DIAGNOSIS — R1310 Dysphagia, unspecified: Secondary | ICD-10-CM | POA: Insufficient documentation

## 2015-03-23 NOTE — Progress Notes (Signed)
   Subjective:    Patient ID: Mark Harmanobert L Giles Sr., male    DOB: Nov 16, 1933, 81 y.o.   MRN: [id number]  HPI Mark Stephens is a 81yo man with PMHx of HTN, AFib on Xarelto, hepatic cirrhosis secondary to alcohol abuse, and chronic dementia who presents today to establish care and for hospital follow up. He is accompanied by his wife who provided the majority of the history.   Acute Encephalopathy 2/2 Hepatic Cirrhosis and Underlying Dementia: Patient was hospitalized from 10/10-10/14 for acute encephalopathy that was determined to be secondary to his cirrhosis and underlying dementia. He was discharged on Lactulose 30 g BID. Wife reports the patient has only been taking Lactulose 30 g once daily and only having 1 bowel movement per day. She notes he has been more confused lately. She reports one day when he did have more BMs he seemed more alert and "with it." He has been compliant with his Spironolactone and Lasix. He denies SOB.   HTN: BP 121/74 today. Patient takes Digoxin 0.125 mg daily, Lasix 40 mg daily, Toprol-XL 50 mg daily, and Spironolactone 25 mg daily. Wife reports with the physical therapist the other day, his systolic BP reading was 85. She is worried his BP is too low.   AFib: Currently on Xarelto and Digoxin. Well controlled.   Dysphagia: Wife reports he had some difficulties swallowing in the hospital but that this improved by discharge. She notes with the lactulose in particular that sometimes it will be running down his face. She does not see this with other liquids or solid foods. She reports he is eating well otherwise.   Malnutrition: Wife reports he drinks Boost three times daily. He has lost about 10 lbs in the last year.    Review of Systems General: Denies fever, chills, night sweats, changes in appetite HEENT: Denies headaches, ear pain, changes in vision, rhinorrhea, sore throat CV: Denies CP, palpitations, orthopnea Pulm: Denies cough, wheezing GI: Denies abdominal  pain, nausea, vomiting, diarrhea, constipation, melena, hematochezia GU: Denies dysuria, hematuria, frequency Msk: Denies muscle cramps, joint pains Neuro: Denies weakness, numbness, tingling Skin: Denies rashes, bruising Psych: Denies depression, anxiety, hallucinations    Objective:   Physical Exam General: elderly man sitting up in chair, confused at times, answers some questions inappropriately, NAD HEENT: [place]/AT, EOMI, sclera anicteric, mucus membranes moist CV: RRR, no m/g/r Pulm: CTA bilaterally, breaths non-labored Abd: BS+, soft, mild distension/ascites, non-tender Ext: 1-2+ pitting edema in LE bilaterally Neuro: alert and oriented to person and hospital ([place]      Assessment & Plan:  Please refer to A&P documentation.

## 2015-03-23 NOTE — Assessment & Plan Note (Signed)
Seems patient is only having dysphagia with the lactulose per the wife (possibly due to the taste?). She reports he is otherwise eating and drinking well. Confirm that this is the case at next visit.

## 2015-03-23 NOTE — Assessment & Plan Note (Signed)
Rate controlled. Continue Xarelto and Digoxin.

## 2015-03-23 NOTE — Assessment & Plan Note (Signed)
Continue Boost TID.

## 2015-03-23 NOTE — Assessment & Plan Note (Signed)
BP Readings from Last 3 Encounters:  03/21/15 121/74  03/14/15 117/72  02/07/15 106/73    Lab Results  Component Value Date   NA 132* 03/14/2015   K 3.7 03/14/2015   CREATININE 0.78 03/14/2015    Assessment: Blood pressure control:  BP well controlled.    Plan: Medications:  Continue Digoxin 0.125 mg daily, Lasix 40 mg daily, Toprol-XL 50 mg daily, and Spironolactone 25 mg daily. Other plans:  - Will not make any medication changes today - Recommended for wife to call clinic if he starts to SBP < 90 frequently or if he becomes symptomatic

## 2015-03-23 NOTE — Assessment & Plan Note (Addendum)
Patient has not been taking his lactulose correctly. He was only taking Lactulose 30 g once daily and only having 1 BM per day. Educated wife on importance of BID dosing and having goal of 3 BMs per day. I performed a MMSE today and he scored a 15/30. I believe this is more due to encephalopathy related to his cirrhosis than underlying dementia. MMSE should be repeated when he is taking his lactulose correctly. - Start Lactulose 30 g BID

## 2015-03-23 NOTE — Assessment & Plan Note (Signed)
Patient not taking Lactulose correctly. Instructed to start taking Lactulose 30 g BID with goal of 3 BMs per day. He is taking his Lasix and Spironolactone. He did have significant 1-2+ pitting edema in his LE. I emphasized the importance of taking these medications to the wife. Will have him f/u in 2 weeks to reassess his fluid status.

## 2015-03-24 NOTE — Progress Notes (Signed)
Internal Medicine Clinic Attending  Case discussed with Dr. Rivet soon after the resident saw the patient.  We reviewed the resident's history and exam and pertinent patient test results.  I agree with the assessment, diagnosis, and plan of care documented in the resident's note.  

## 2015-04-07 ENCOUNTER — Telehealth: Payer: Self-pay | Admitting: *Deleted

## 2015-04-07 NOTE — Telephone Encounter (Signed)
Home PT called clinic -  Since 04/05/15 pt has had right foot drop. Right foot according to pt is more numb then the left. Wife states feet are always cold. Talked with Dr Rogelia BogaButcher - [facility] - talked with Thayer DallasKaye King at home [phone] and aware to take pt to ER. Stanton KidneyDebra Rooney Swails RN 04/07/15 4PM

## 2015-04-10 ENCOUNTER — Ambulatory Visit (INDEPENDENT_AMBULATORY_CARE_PROVIDER_SITE_OTHER): Payer: Medicare Other | Admitting: Internal Medicine

## 2015-04-10 ENCOUNTER — Encounter: Payer: Self-pay | Admitting: Internal Medicine

## 2015-04-10 VITALS — BP 109/76 | HR 79 | Temp 97.4°F | Ht 71.0 in | Wt 152.0 lb

## 2015-04-10 DIAGNOSIS — G9340 Encephalopathy, unspecified: Secondary | ICD-10-CM

## 2015-04-10 DIAGNOSIS — I10 Essential (primary) hypertension: Secondary | ICD-10-CM

## 2015-04-10 DIAGNOSIS — M21371 Foot drop, right foot: Secondary | ICD-10-CM | POA: Diagnosis not present

## 2015-04-10 DIAGNOSIS — K7031 Alcoholic cirrhosis of liver with ascites: Secondary | ICD-10-CM

## 2015-04-10 DIAGNOSIS — Z79899 Other long term (current) drug therapy: Secondary | ICD-10-CM | POA: Diagnosis not present

## 2015-04-10 NOTE — Assessment & Plan Note (Signed)
BP Readings from Last 3 Encounters:  04/10/15 109/76  03/21/15 121/74  03/14/15 117/72    Lab Results  Component Value Date   NA 132* 03/14/2015   K 3.7 03/14/2015   CREATININE 0.78 03/14/2015    Assessment: Blood pressure control:   Controlled  Progress toward BP goal:   At goal Comments: Compliant with spironolactone 25 mg daily and lasix 40 mg daily.  Plan: Medications:  continue current medications

## 2015-04-10 NOTE — Assessment & Plan Note (Addendum)
Patient presents with complaint of right foot drop since Saturday. PT and patient's wife think patient was sitting on the porch for an extended period of time and then as he tried to ambulate back into the house, he could not dorsiflex his right foot. Patient continues to have right foot drop. He denies any other focal neurological deficits. He has normal sensation bilaterally. Physical exam reveals a normal neuro exam except for 0/5 right foot drop. Patient has been seeing PT, but order expired this weak. Given lack of other focal neurological deficits and inability to tolerate stroke prevention medications, we will not purse imaging. Patient is already on Xarelto QD. Differential includes neurologic, muscular or anatomic origin. Nerve involvement is likely common versus deep peroneal. Patient denies any trauma to right lateral knee. Doubt trauma or tear of tibialis anterior, extensor hallicus longus or extensor digitorum longus. Normal plantarflexion, inversion and eversion.   Plan: -Referral to PT/OT -Will need continued research of potential causes and management -Follow up 2 weeks

## 2015-04-10 NOTE — Assessment & Plan Note (Addendum)
Patient's wife states patient's encephalopathy seems to be improved compared with prior. However, patient is only taking lactulose 30 mg every other day, but still having 3 loose stools per day. Wife states patient refuses to take the lactulose more than that. His encephalopathy severity score is mild today- he is awake, alert and responsive. He continues to have ascites and protuberant abdomen and 2-3+ pitting edema in his lower extremities bilaterally.   Plan: -Continue lasix 40 mg daily and spironolactone 25 mg daily as patient's BP may not be able to tolerate much more -If abdomen becomes uncomfortable or painful, consider repeat paracentesis -May require Rifaximin in the future -I am not sure if patient would be a good candidate for liver transplant versus TIPS, will need to consider this -Referral to Hepatology -Will need to discuss goals of care

## 2015-04-10 NOTE — Patient Instructions (Signed)
FOLLOW UP IN TWO WEEKS

## 2015-04-10 NOTE — Progress Notes (Signed)
Subjective:    Patient ID: Mark Harmanobert L Gildersleeve Sr., male    DOB: 21-Oct-1933, 81 y.o.   MRN: [id number]  HPI Mark HarmanRobert L Brigance Sr. is a 81 y.o. male with PMHx of HTN, Atrial fibrillation on Xarelto, cirrhosis, dementia who presents to the clinic for follow up for hepatic cirrhosis with encepahlopathy. Please see A&P for the status of the patient's chronic medical problems.   Past Medical History  Diagnosis Date  . Hypertension   . Heart murmur   . Atrial fibrillation (HCC) 11/27/14  . Dementia   . Dysrhythmia   . Liver cirrhosis (HCC)   . Cirrhosis of liver (HCC)   . Ascites     Outpatient Encounter Prescriptions as of 04/10/2015  Medication Sig  . allopurinol (ZYLOPRIM) 100 MG tablet Take 100 mg by mouth daily.    . digoxin (LANOXIN) 0.25 MG tablet TAKE ONE-HALF TO ONE TABLET BY MOUTH ONCE DAILY  . folic acid (FOLVITE) 400 MCG tablet Take 400 mcg by mouth daily.  . furosemide (LASIX) 20 MG tablet Take 40 mg by mouth daily.  Marland Kitchen. lactulose (CHRONULAC) 10 GM/15ML solution Take 45 mLs (30 g total) by mouth 2 (two) times daily.  . metoprolol (TOPROL-XL) 50 MG 24 hr tablet Take 50 mg by mouth daily.    . Rivaroxaban (XARELTO) 20 MG TABS Take 1 tablet (20 mg total) by mouth daily.  Marland Kitchen. spironolactone (ALDACTONE) 25 MG tablet Take 25 mg by mouth daily.  . tamsulosin (FLOMAX) 0.4 MG CAPS capsule Take 0.4 mg by mouth daily.   No facility-administered encounter medications on file as of 04/10/2015.    Family History  Problem Relation Age of Onset  . Hypertension Mother   . Diabetes Mother   . Alzheimer's disease Mother     Social History   Social History  . Marital Status: Married    Spouse Name: N/A  . Number of Children: N/A  . Years of Education: N/A   Occupational History  . Not on file.   Social History Main Topics  . Smoking status: Never Smoker   . Smokeless tobacco: Never Used  . Alcohol Use: 8.4 oz/week    14 Glasses of wine per week     Comment: occasional  . Drug  Use: No  . Sexual Activity: Not on file   Other Topics Concern  . Not on file   Social History Narrative    Review of Systems General: Admits to decreased appetite. Denies fever, chills. Respiratory: Denies SOB, DOE   Cardiovascular: Denies chest pain and palpitations.  Gastrointestinal: Admits to abdominal distention and loose stools. Denies nausea, vomiting, abdominal pain, constipation, blood in stool.  Musculoskeletal: Admits to right foot drop and difficulty ambulating. Denies myalgias, back pain, joint swelling, arthralgias  Neurological: Admits to generalized weakness. Denies dizziness, headaches, lightheadedness Psychiatric/Behavioral: Admits to intermittent confusion, improved.     Objective:   Physical Exam Filed Vitals:   04/10/15 1525  BP: 109/76  Pulse: 79  Temp: 97.4 F (36.3 C)  TempSrc: Oral  Height: 5\' 11"  (1.803 m)  Weight: 152 lb (68.947 kg)  SpO2: 100%   General: Vital signs reviewed.  Patient is chronically ill-appearing, in no acute distress and cooperative with exam.  Cardiovascular: Irregularly irregular, 2/6 blowing systolic ejection murmur.  Pulmonary/Chest: Clear to auscultation bilaterally, no wheezes, rales, or rhonchi. Abdominal: Distended, protuberant, +ascites, BS +, no guarding present.  Musculoskeletal: +Right foot drop, inability to dorsiflex right foot. Normal plantarflexion, eversion, inversion.  Extremities: 2-3+  pitting edema bilaterally to shins Neurological: A&O x2 (did not know date), Strength is normal and symmetric bilaterally (except for 0/5 right dorsiflexion), cranial nerve II-XII are grossly intact, sensory intact to light touch bilaterally.  Skin: Jaundiced, evidence of telangiectasias.  Psychiatric: Normal mood and affect. Speech and behavior is normal. Cognition and memory are abnormal.     Assessment & Plan:   Please see problem based assessment and plan.

## 2015-04-10 NOTE — Assessment & Plan Note (Signed)
Patient has minimal encephalopathy on examination. Likely construed by his underlying dementia. MMSE today was 23/30, improved from 10/23 which was 15/30.

## 2015-04-11 NOTE — Progress Notes (Signed)
Internal Medicine Clinic Attending  I saw and evaluated the patient.  I personally confirmed the key portions of the history and exam documented by Dr. Rivet and I reviewed pertinent patient test results.  The assessment, diagnosis, and plan were formulated together and I agree with the documentation in the resident's note. 

## 2015-04-15 NOTE — Addendum Note (Signed)
Addended by: Neomia DearPOWERS, Virgie Kunda E on: 04/15/2015 06:02 PM   Modules accepted: Orders

## 2015-04-23 ENCOUNTER — Other Ambulatory Visit: Payer: Self-pay | Admitting: Internal Medicine

## 2015-04-23 ENCOUNTER — Ambulatory Visit (INDEPENDENT_AMBULATORY_CARE_PROVIDER_SITE_OTHER): Payer: Medicare Other | Admitting: Internal Medicine

## 2015-04-23 VITALS — BP 107/68 | HR 84 | Wt 146.3 lb

## 2015-04-23 DIAGNOSIS — K7031 Alcoholic cirrhosis of liver with ascites: Secondary | ICD-10-CM | POA: Diagnosis not present

## 2015-04-23 DIAGNOSIS — F03A0 Unspecified dementia, mild, without behavioral disturbance, psychotic disturbance, mood disturbance, and anxiety: Secondary | ICD-10-CM

## 2015-04-23 DIAGNOSIS — F0390 Unspecified dementia without behavioral disturbance: Secondary | ICD-10-CM

## 2015-04-23 DIAGNOSIS — M21371 Foot drop, right foot: Secondary | ICD-10-CM | POA: Diagnosis not present

## 2015-04-23 NOTE — Assessment & Plan Note (Addendum)
Patient has hepatic cirrhosis with encephalopathy and ascites secondary to alcohol abuse. He is currently managed with lactulose 30 gram every other ay, lasix 40 mg daily, spironolactone 25 mg daily and prn paracenteses.   Patient has had persistent abdominal distension and discomfort. His last paracentesis was 5-6 weeks ago. Given his discomfort and distension, we elected to perform a paracentesis today. Patient and wife agreed to procedure and signed consent forms.   Ultrasound-guided therapeutic paracentesis was performed with a 19 gauge centesis needle-cath placed into the peritoneal space in the right lower abdominal quadrant.  Specimen:  Approximately 1000 cc of  Yellow, ascitic fluid were removed.  Complications:  None.  Disposition:  [facility].  Plan: -Continue spironolactone 25 mg daily -Continue lasix 40 mg daily -Continue lactulose 30 grams titrate to 2-3 bowel movements per day -Referral placed to gastroenterology

## 2015-04-23 NOTE — Assessment & Plan Note (Addendum)
2 weeks ago patient developed right foot drop after sitting outside on his porch with his legs crossed. Despite working with PT, patient's foot drop did not improve. Foot drop is likely secondary to idiopathic common peroneal palsy versus nerve entrapment with injury at the fibular neck. Patient cannot dorsiflex or evert the foot. He is able to walk, but must use high stepping technique leading toward a lower motor neuron lesion. If upper motor neuron, patient would not be able to clear the ground due to weak hip flexors. If it was due to myopathy, he would have waddling gait, abnormal pelvic tilt with each step. Patient has a poor prognosis for recovery given his lack of strength and no significant recovery. Recommendations are for an EMG/NCS and referral to sports medicine for possible ankle/foot orthosis. Patient and wife feel that his symptoms are controlled now and he is able to compensate. They do not feel he needs a referral at this time or that he is a fall risk.   Plan: -If symptoms become bothersome, consider referral to Sports Medicine for ankle/foot orthosis -Continue PT -Consider EMG/NCS

## 2015-04-23 NOTE — Progress Notes (Signed)
Subjective:    Patient ID: Mark Harmanobert L Happel Sr., male    DOB: Jun 19, 1933, 81 y.o.   MRN: [id number]  HPI Mark HarmanRobert L Joplin Sr. is a 81 y.o. male with PMHx of hepatic cirrhosis with encephalopathy and ascited who presents to the clinic for follow up for foot drop and for hepatic cirrhosis. Please see A&P for the status of the patient's chronic medical problems.   Past Medical History  Diagnosis Date  . Hypertension   . Heart murmur   . Atrial fibrillation (HCC) 11/27/14  . Dementia   . Dysrhythmia   . Liver cirrhosis (HCC)   . Cirrhosis of liver (HCC)   . Ascites     Outpatient Encounter Prescriptions as of 04/23/2015  Medication Sig  . allopurinol (ZYLOPRIM) 100 MG tablet Take 100 mg by mouth daily.    . digoxin (LANOXIN) 0.25 MG tablet TAKE ONE-HALF TO ONE TABLET BY MOUTH ONCE DAILY  . folic acid (FOLVITE) 400 MCG tablet Take 400 mcg by mouth daily.  . furosemide (LASIX) 20 MG tablet Take 40 mg by mouth daily.  Marland Kitchen. lactulose (CHRONULAC) 10 GM/15ML solution Take 45 mLs (30 g total) by mouth 2 (two) times daily.  . metoprolol (TOPROL-XL) 50 MG 24 hr tablet Take 50 mg by mouth daily.    . Rivaroxaban (XARELTO) 20 MG TABS Take 1 tablet (20 mg total) by mouth daily.  Marland Kitchen. spironolactone (ALDACTONE) 25 MG tablet Take 25 mg by mouth daily.  . tamsulosin (FLOMAX) 0.4 MG CAPS capsule Take 0.4 mg by mouth daily.   No facility-administered encounter medications on file as of 04/23/2015.    Family History  Problem Relation Age of Onset  . Hypertension Mother   . Diabetes Mother   . Alzheimer's disease Mother     Social History   Social History  . Marital Status: Married    Spouse Name: N/A  . Number of Children: N/A  . Years of Education: N/A   Occupational History  . Not on file.   Social History Main Topics  . Smoking status: Never Smoker   . Smokeless tobacco: Never Used  . Alcohol Use: 8.4 oz/week    14 Glasses of wine per week     Comment: occasional  . Drug Use: No    . Sexual Activity: Not on file   Other Topics Concern  . Not on file   Social History Narrative   Review of Systems General: Admits to fatigue, decreased appetite. Denies fever, chills, and diaphoresis.  Respiratory: Denies SOB, DOE.   Cardiovascular: Denies chest pain and palpitations.  Gastrointestinal: Admits to abdominal discomfort. Denies nausea, vomiting, diarrhea, constipation. Genitourinary: Denies dysuria, urgency  MSK: Admits to right foot drop. Skin: Denies rash and wounds.  Neurological: Denies dizziness, headaches, weakness, lightheadedness, numbness     Objective:   Physical Exam Filed Vitals:   04/23/15 1453  BP: 107/68  Pulse: 84  Weight: 146 lb 4.8 oz (66.361 kg)  SpO2: 99%   General: Vital signs reviewed.  Patient is chronically ill appearing, in no acute distress and cooperative with exam.  Eyes: No scleral icterus.  Cardiovascular: Irregularly irregular, +2/6 blowing systolic murmur Pulmonary/Chest: Clear to auscultation bilaterally, no wheezes, rales, or rhonchi. Abdominal: Distended, non-tender, +fluid wave, tympanic, no guarding present.  Musculoskeletal: +Right foot drop, 0/5 dorsiflexion of right foot, decrease eversion. High stepping gait. Extremities: 2+ lower extremity edema bilaterally Neurological: A&O x2, sensory intact to light touch bilaterally.  Skin: +Jaundice, +telangiectasias  Assessment & Plan:   Please see problem based assessment and plan.

## 2015-04-23 NOTE — Patient Instructions (Signed)
FOLLOW UP IN 2 MONTHS, SOONER IF NEEDED.   WE WILL REFER YOU TO A NEW GASTROENTEROLOGIST.

## 2015-04-30 NOTE — Progress Notes (Signed)
Internal Medicine Clinic Attending  I saw and evaluated the patient.  I personally confirmed the key portions of the history and exam documented by Dr. Richardson and I reviewed pertinent patient test results.  The assessment, diagnosis, and plan were formulated together and I agree with the documentation in the resident's note.  I was present for entire procedure and assisted where needed.  

## 2015-05-14 ENCOUNTER — Encounter: Payer: Self-pay | Admitting: Cardiovascular Disease

## 2015-05-14 ENCOUNTER — Ambulatory Visit: Payer: Medicare Other | Admitting: Cardiovascular Disease

## 2015-05-14 ENCOUNTER — Ambulatory Visit (INDEPENDENT_AMBULATORY_CARE_PROVIDER_SITE_OTHER): Payer: Medicare Other | Admitting: Cardiovascular Disease

## 2015-05-14 VITALS — BP 121/77 | HR 80 | Ht 71.0 in | Wt 145.0 lb

## 2015-05-14 DIAGNOSIS — R2689 Other abnormalities of gait and mobility: Secondary | ICD-10-CM

## 2015-05-14 DIAGNOSIS — I4891 Unspecified atrial fibrillation: Secondary | ICD-10-CM | POA: Diagnosis not present

## 2015-05-14 DIAGNOSIS — G9340 Encephalopathy, unspecified: Secondary | ICD-10-CM | POA: Diagnosis not present

## 2015-05-14 DIAGNOSIS — I10 Essential (primary) hypertension: Secondary | ICD-10-CM

## 2015-05-14 DIAGNOSIS — R600 Localized edema: Secondary | ICD-10-CM

## 2015-05-14 DIAGNOSIS — K7031 Alcoholic cirrhosis of liver with ascites: Secondary | ICD-10-CM

## 2015-05-14 DIAGNOSIS — E43 Unspecified severe protein-calorie malnutrition: Secondary | ICD-10-CM

## 2015-05-14 MED ORDER — RIVAROXABAN 15 MG PO TABS: 15.0000 mg | ORAL_TABLET | Freq: Every day | ORAL | Status: DC

## 2015-05-14 NOTE — Assessment & Plan Note (Signed)
Chronic atrial fibrillation on anticoagulation High fall risk, also with significant anemia, elevated INR likely secondary to underlying liver disease Long discussion concerning anticoagulation on xarelto High risk of bleeding given his underlying liver disease. Recent EGD reviewed with his wife, no significant varices. We have recommended he decrease the dose of the xarelto down to 15 mg daily even his other high risk issues as above. For any worsening liver disease, high risk falls, would hold xarelto has risk would outweigh any benefit. This was discussed with patient's wife

## 2015-05-14 NOTE — Patient Instructions (Addendum)
You are doing well.  Please decrease the xarelto down to 15 mg daily  Hoyer lift if needed  Please call us if you have new issues that need to be addressed before your next appt.  Your physician wants you to follow-up in: 6 months.  You will receive a reminder letter in the mail two months in advance. If you don't receive a letter, please call our office to schedule the follow-up appointment.

## 2015-05-14 NOTE — Assessment & Plan Note (Signed)
He has ascites, history of paracentesis, managed by Dr. Criselda PeachesMullen Long history of alcohol

## 2015-05-14 NOTE — Assessment & Plan Note (Signed)
Recommended he take his lactulose at least daily. He is reluctant to do this given the loose bowel movements. Wife reports some waxing waning  encephalopathy

## 2015-05-14 NOTE — Assessment & Plan Note (Signed)
Liver dysfunction and poor intake likely contributing to albumin of 2, third spacing, leg edema, ascites

## 2015-05-14 NOTE — Assessment & Plan Note (Signed)
Significant pitting edema to the mid shins Likely secondary to profound anemia, albumin of 2 Encouraged him to increase his food intake

## 2015-05-14 NOTE — Progress Notes (Signed)
Patient ID: Arlyce Harmanobert L Stmartin Sr., male    DOB: 1933-09-18, 81 y.o.   MRN: [id number]  HPI Comments: 81 year old male with a history of chronic atrial fibrillation, hypertension, hyperlipidemia, GI bleed on warfarin in January 2011, with INR of 7,  h/o shortness of breath and mitral valve regurgitation, presents for routine follow up of his atrial fibrillation.  prior hip replacement surgery.  He does have a significant alcohol history Recent admission to the hospital for encephalopathy secondary to liver failure, discharged on lactulose  In follow-up today, he presents with his wife. He has been taking lactulose possibly every other day, does not like to take this daily Wife reports some waxing waning confusion, some days are definitely better than others. He was relatively nonverbal through his entire visit He sits in the house all day, wife does all the ADLs No significant bruising or bleeding He does not eat well, periods of anorexia. She is giving him boost  Review of lab work with her shows albumin of 2 in October 2016, hematocrit 26 INR 1.8 No further blood work since that time On xarelto 20 mg daily   EKG on today's visit shows atrial fibrillation with ventricular rate 80 bpm, no significant ST or T-wave changes    other past medical history  previously elevated potassium. He's not been taking his HCTZ on a regular basis, only as needed. He seems to take HCTZ when he feels that he has to urinate. Recent lab work showing total cholesterol 175, LDL 113, HDL 48 He does not want a cholesterol medication  Outside echo Suggests moderate mitral valve regurgitation with mild mitral valve prolapse, mildly dilated left atrium, one study suggesting moderate tricuspid valve regurgitation    No Known Allergies  Outpatient Encounter Prescriptions as of 05/14/2015  Medication Sig  . allopurinol (ZYLOPRIM) 100 MG tablet Take 100 mg by mouth daily.    . digoxin (LANOXIN) 0.25 MG tablet  TAKE ONE-HALF TO ONE TABLET BY MOUTH ONCE DAILY  . folic acid (FOLVITE) 400 MCG tablet Take 400 mcg by mouth daily.  . furosemide (LASIX) 20 MG tablet Take 40 mg by mouth daily.  Marland Kitchen. lactulose (CHRONULAC) 10 GM/15ML solution Take 45 mLs (30 g total) by mouth 2 (two) times daily.  . metoprolol (TOPROL-XL) 50 MG 24 hr tablet Take 50 mg by mouth daily.    . Rivaroxaban (XARELTO) 15 MG TABS tablet Take 1 tablet (15 mg total) by mouth daily.  Marland Kitchen. spironolactone (ALDACTONE) 25 MG tablet Take 25 mg by mouth daily.  . tamsulosin (FLOMAX) 0.4 MG CAPS capsule Take 0.4 mg by mouth daily.  . [DISCONTINUED] Rivaroxaban (XARELTO) 20 MG TABS Take 1 tablet (20 mg total) by mouth daily.   No facility-administered encounter medications on file as of 05/14/2015.    Past Medical History  Diagnosis Date  . Hypertension   . Heart murmur   . Atrial fibrillation (HCC) 11/27/14  . Dementia   . Dysrhythmia   . Liver cirrhosis (HCC)   . Cirrhosis of liver (HCC)   . Ascites     Past Surgical History  Procedure Laterality Date  . Appendectomy    . Total hip arthroplasty  2012    right  . Joint replacement    . Esophagogastroduodenoscopy (egd) with propofol N/A 02/06/2015    Procedure: ESOPHAGOGASTRODUODENOSCOPY (EGD) WITH PROPOFOL;  Surgeon: Wallace CullensPaul Y Oh, MD;  [facility];  Service: Gastroenterology;  Laterality: N/A;    Social History  reports that he has  never smoked. He has never used smokeless tobacco. He reports that he drinks about 8.4 oz of alcohol per week. He reports that he does not use illicit drugs.  Family History family history includes Alzheimer's disease in his mother; Diabetes in his mother; Hypertension in his mother.   Review of Systems  Constitutional: Negative.   HENT:       Nosebleeds  Respiratory: Negative.   Cardiovascular: Negative.   Gastrointestinal: Negative.   Endocrine: Negative.   Musculoskeletal: Positive for gait problem.  Skin: Negative.   Neurological:  Negative.   Hematological: Negative.   Psychiatric/Behavioral: Negative.   All other systems reviewed and are negative.   BP 121/77 mmHg  Pulse 80  Ht  (1.803 m)  Wt 145 lb (65.772 kg)  BMI 20.23 kg/m2  Physical Exam  Constitutional: He is oriented to person, place, and time.  Thin, pale  HENT:  Head: Normocephalic.  Nose: Nose normal.  Mouth/Throat: Oropharynx is clear and moist.  Eyes: Conjunctivae are normal. Pupils are equal, round, and reactive to light.  Neck: Normal range of motion. Neck supple. No JVD present.  Cardiovascular: Normal rate, S1 normal, S2 normal and intact distal pulses.  An irregularly irregular rhythm present. Exam reveals no gallop and no friction rub.   Murmur heard.  Systolic murmur is present with a grade of 2/6  Pulmonary/Chest: Effort normal and breath sounds normal. No respiratory distress. He has no wheezes. He has no rales. He exhibits no tenderness.  Abdominal: Soft. Bowel sounds are normal. There is no tenderness.  Distended, dull to percussion, nontender to palpation  Musculoskeletal: Normal range of motion. He exhibits no edema or tenderness.  Lymphadenopathy:    He has no cervical adenopathy.  Neurological: He is alert and oriented to person, place, and time. Coordination normal.  Skin: Skin is warm and dry. No rash noted. No erythema.  Psychiatric:  Relatively nonverbal, will answer questions appropriately    Assessment and Plan   Nursing note and vitals reviewed.

## 2015-05-14 NOTE — Assessment & Plan Note (Signed)
No recent falls. Anticoagulation dose decreased.  Overall has generalized weakness

## 2015-05-28 ENCOUNTER — Other Ambulatory Visit: Payer: Self-pay | Admitting: Internal Medicine

## 2015-06-10 ENCOUNTER — Telehealth: Payer: Self-pay | Admitting: *Deleted

## 2015-06-18 ENCOUNTER — Ambulatory Visit (INDEPENDENT_AMBULATORY_CARE_PROVIDER_SITE_OTHER): Payer: Medicare Other | Admitting: Internal Medicine

## 2015-06-18 ENCOUNTER — Ambulatory Visit (HOSPITAL_COMMUNITY)
Admission: RE | Admit: 2015-06-18 | Discharge: 2015-06-18 | Disposition: A | Discharge status: Home / Self Care | Payer: Medicare Other | Source: Ambulatory Visit | Attending: Internal Medicine | Admitting: Internal Medicine

## 2015-06-18 ENCOUNTER — Other Ambulatory Visit: Payer: Self-pay | Admitting: Internal Medicine

## 2015-06-18 ENCOUNTER — Encounter: Payer: Self-pay | Admitting: Internal Medicine

## 2015-06-18 VITALS — BP 105/68 | HR 94 | Temp 97.6°F | Ht 71.0 in | Wt 154.3 lb

## 2015-06-18 DIAGNOSIS — M25551 Pain in right hip: Secondary | ICD-10-CM

## 2015-06-18 DIAGNOSIS — I4891 Unspecified atrial fibrillation: Secondary | ICD-10-CM | POA: Diagnosis not present

## 2015-06-18 DIAGNOSIS — F1020 Alcohol dependence, uncomplicated: Secondary | ICD-10-CM

## 2015-06-18 DIAGNOSIS — Z96641 Presence of right artificial hip joint: Secondary | ICD-10-CM | POA: Insufficient documentation

## 2015-06-18 DIAGNOSIS — K7031 Alcoholic cirrhosis of liver with ascites: Secondary | ICD-10-CM

## 2015-06-18 DIAGNOSIS — Z7901 Long term (current) use of anticoagulants: Secondary | ICD-10-CM

## 2015-06-18 DIAGNOSIS — R188 Other ascites: Secondary | ICD-10-CM

## 2015-06-18 DIAGNOSIS — G8929 Other chronic pain: Secondary | ICD-10-CM

## 2015-06-18 LAB — BODY FLUID CELL COUNT WITH DIFFERENTIAL
Eos, Fluid: 0 %
LYMPHS FL: 51 %
Monocyte-Macrophage-Serous Fluid: 35 % — ABNORMAL LOW (ref 50–90)
NEUTROPHIL FLUID: 14 % (ref 0–25)
Total Nucleated Cell Count, Fluid: 98 cu mm (ref 0–1000)

## 2015-06-18 LAB — POCT INR: INR: 1.4

## 2015-06-18 NOTE — Progress Notes (Signed)
Procedure Note   Procedure: Abdominal Paracentesis  Performed By: Griffin Basil, PGY-2  Attending: Dr. Oswaldo Done  Indication: Ascites  Technique: After informed consent was obtained and the patient was correctly identified, the patient was prepped and draped in the standard fashion. The patient was supine. 1% lidocaine was used as local anesthesia. An ultrasound was used to locate a pocket of ascites. The paracentesis catheter was inserted and advanced with negative pressure until straw colored fluid was aspirated. Approximately 8 mL of ascitic fluid was collected and sent for laboratory analysis. The catheter was then connected to the vaccutainer and 4.5 liters of additional ascitic fluid were drained. The catheter was removed. No leaking noted. A dressing was placed over the puncture wound.  Complications: None  Procedure Tolerated: Well

## 2015-06-18 NOTE — Patient Instructions (Signed)
Mark Stephens - -   Please come back to see me in about 2 months.  For your hip pain, you can get your xray today or another day.    Thank you!  Paracentesis, Care After Refer to this sheet in the next few weeks. These instructions provide you with information about caring for yourself after your procedure. Your health care provider may also give you more specific instructions. Your treatment has been planned according to current medical practices, but problems sometimes occur. Call your health care provider if you have any problems or questions after your procedure. WHAT TO EXPECT AFTER THE PROCEDURE After your procedure, it is common to have a small amount of clear fluid coming from the puncture site. HOME CARE INSTRUCTIONS  Return to your normal activities as told by your health care provider. Ask your health care provider what activities are safe for you.  Take over-the-counter and prescription medicines only as told by your health care provider.  Do not take baths, swim, or use a hot tub until your health care provider approves.  Follow instructions from your health care provider about:  How to take care of your puncture site.  When and how you should change your bandage (dressing).  When you should remove your dressing.  Check your puncture area every day signs of infection. Watch for:  Redness, swelling, or pain.  Fluid, blood, or pus.  Keep all follow-up visits as told by your health care provider. This is important. SEEK MEDICAL CARE IF:  You have redness, swelling, or pain at your puncture site.  You start to have more clear fluid coming from your puncture site.  You have blood or pus coming from your puncture site.  You have chills.  You have a fever. SEEK IMMEDIATE MEDICAL CARE IF:  You develop chest pain or shortness of breath.  You develop increasing pain, discomfort, or swelling in your abdomen.  You feel dizzy or light-headed or you pass out.   This  information is not intended to replace advice given to you by your health care provider. Make sure you discuss any questions you have with your health care provider.   Document Released: 10/01/2014 Document Reviewed: 10/01/2014 Elsevier Interactive Patient Education [employer]

## 2015-06-18 NOTE — Progress Notes (Signed)
   Subjective:    Patient ID: Mark Harman Sr., male    DOB: [birth year], 82 y.o.   MRN: [id number]  CC: routine follow up, right hip pain  HPI  Mr. 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   Patient expresses no complaints.  His wife is present and gives much of the history.  She reports that he has been having more hip pain.  He has hardware in the right hip and she is concerned that the hardware may have moved.    Also note dry itchy eyes and using eye drops more often.    As concerns his liver, he has some baseline confusion and hallucinations which are improved with taking lactulose.  He does not like to take the medication, however, because it causes him to go to the bathroom too often.  When he does take it, he is having upwards of 3 BM per day.  He has been slowly accumulating fluid.  He had a paracentesis in November of 2016. Today, he has increased swelling in his legs and a protuberant abdomen.  He has no abdominal pain.  He has been taking his diuretics daily.  MELD score is 15 which gives him a 6% 3 month mortality.  His wife is requesting referral to a Liver specialist.    Review of Systems  Constitutional: Positive for fatigue. Negative for fever and chills.  Eyes: Positive for pain and itching. Negative for photophobia and visual disturbance.  Respiratory: Positive for shortness of breath. Negative for cough.   Cardiovascular: Negative for chest pain.  Gastrointestinal: Positive for abdominal pain, diarrhea and abdominal distention. Negative for blood in stool.  Genitourinary: Negative for enuresis and difficulty urinating.  Musculoskeletal: Positive for arthralgias and gait problem.  Neurological: Positive for dizziness and light-headedness. Negative for weakness.  Psychiatric/Behavioral: Positive for confusion and decreased concentration.       Objective:   Physical Exam  Constitutional:    Thin, appears under nourished  HENT:  Head: Normocephalic and atraumatic.  Eyes: Conjunctivae are normal.  Slight yellowing of eyes  Cardiovascular: Normal rate, regular rhythm and normal heart sounds.   Pulmonary/Chest: Effort normal and breath sounds normal. No respiratory distress.  Crackles in bases  Abdominal: He exhibits distension. There is no tenderness. There is no rebound and no guarding.  Bowel sounds decreased  Musculoskeletal: He exhibits edema. He exhibits no tenderness.  Neurological: He is alert. He exhibits normal muscle tone. Coordination normal.  Skin:  Mild yellowing of skin  Psychiatric: He has a normal mood and affect. His behavior is normal.  Very pleasant, but confused and not able to answer all questions.  Wife assisted with interview    CMET, CBC, INR  Plan for hip xray      Assessment & Plan:  RTC in 3 months, sooner if needed

## 2015-06-19 LAB — CBC
HEMATOCRIT: 35.6 % — AB (ref 37.5–51.0)
Hemoglobin: 12.3 g/dL — ABNORMAL LOW (ref 12.6–17.7)
MCH: 33.6 pg — ABNORMAL HIGH (ref 26.6–33.0)
MCHC: 34.6 g/dL (ref 31.5–35.7)
MCV: 97 fL (ref 79–97)
Platelets: 347 10*3/uL (ref 150–379)
RBC: 3.66 x10E6/uL — AB (ref 4.14–5.80)
RDW: 13.7 % (ref 12.3–15.4)
WBC: 11.3 10*3/uL — AB (ref 3.4–10.8)

## 2015-06-19 LAB — CMP14 + ANION GAP
ALBUMIN: 3 g/dL — AB (ref 3.5–4.8)
ALK PHOS: 101 IU/L (ref 39–117)
ALT: 13 IU/L (ref 0–44)
ANION GAP: 14 mmol/L (ref 10.0–18.0)
AST: 32 IU/L (ref 0–40)
Albumin/Globulin Ratio: 0.9 — ABNORMAL LOW (ref 1.1–2.5)
BUN / CREAT RATIO: 13 (ref 10–22)
BUN: 8 mg/dL (ref 8–27)
Bilirubin Total: 2.1 mg/dL — ABNORMAL HIGH (ref 0.0–1.2)
CALCIUM: 9.1 mg/dL (ref 8.6–10.2)
CO2: 24 mmol/L (ref 18–29)
CREATININE: 0.61 mg/dL — AB (ref 0.76–1.27)
Chloride: 97 mmol/L (ref 96–106)
GFR calc Af Amer: 110 mL/min/{1.73_m2} (ref >59)
GFR, EST NON AFRICAN AMERICAN: 95 mL/min/{1.73_m2} (ref >59)
Globulin, Total: 3.2 g/dL (ref 1.5–4.5)
Glucose: 115 mg/dL — ABNORMAL HIGH (ref 65–99)
Potassium: 4.7 mmol/L (ref 3.5–5.2)
Sodium: 135 mmol/L (ref 134–144)
TOTAL PROTEIN: 6.2 g/dL (ref 6.0–8.5)

## 2015-06-19 LAB — PATHOLOGIST SMEAR REVIEW

## 2015-06-21 LAB — BODY FLUID CULTURE: CULTURE: NO GROWTH

## 2015-06-23 DIAGNOSIS — M25559 Pain in unspecified hip: Secondary | ICD-10-CM

## 2015-06-23 DIAGNOSIS — G8929 Other chronic pain: Secondary | ICD-10-CM | POA: Insufficient documentation

## 2015-06-23 NOTE — Assessment & Plan Note (Signed)
Related to ETOH.    For encephalopathy: Encouraged daily use of lactulose to help with confusion and hallucinations.  There has been some concern that he has dementia which is contributing as well.  Continue to monitor.   For volume, ascites, edema: Paracentesis today.  Continue diuretics.   Referral to Liver specialist.

## 2015-06-23 NOTE — Assessment & Plan Note (Signed)
Paracentesis today by Dr. Isabella Bowens and Dr. Oswaldo Done.

## 2015-06-23 NOTE — Assessment & Plan Note (Signed)
Right hip x-ray

## 2015-06-23 NOTE — Assessment & Plan Note (Signed)
Rate controlled, following with Cardiology.  Recently decreased to xarelto  given his liver disease.

## 2015-07-11 ENCOUNTER — Telehealth: Payer: Self-pay | Admitting: *Deleted

## 2015-07-11 NOTE — Telephone Encounter (Signed)
KAYE CALLING FOR [name]. KAYE SAYS SOME PAPERS FROM [facility] WAS TO BE FAXED TO DR Criselda Peaches. AND IS WANTING TO KNOW IF SHE HAD GOTTEN THEM.SHE IS WANTING DR MULLEN TO CALL HER AT [phone].

## 2015-07-30 ENCOUNTER — Other Ambulatory Visit: Payer: Self-pay | Admitting: Internal Medicine

## 2015-07-30 DIAGNOSIS — K7031 Alcoholic cirrhosis of liver with ascites: Secondary | ICD-10-CM

## 2015-08-05 ENCOUNTER — Other Ambulatory Visit: Payer: Self-pay | Admitting: *Deleted

## 2015-08-05 ENCOUNTER — Telehealth: Payer: Self-pay | Admitting: Internal Medicine

## 2015-08-05 MED ORDER — DIGOXIN 250 MCG PO TABS: ORAL_TABLET | ORAL | Status: DC

## 2015-08-05 NOTE — Telephone Encounter (Signed)
APPT. REMINDER CALL, LMTCB °

## 2015-08-06 ENCOUNTER — Encounter: Payer: Self-pay | Admitting: Internal Medicine

## 2015-08-06 ENCOUNTER — Ambulatory Visit (INDEPENDENT_AMBULATORY_CARE_PROVIDER_SITE_OTHER): Payer: Medicare Other | Admitting: Internal Medicine

## 2015-08-06 VITALS — BP 108/66 | HR 98 | Temp 97.6°F | Ht 71.0 in | Wt 161.0 lb

## 2015-08-06 DIAGNOSIS — R188 Other ascites: Secondary | ICD-10-CM

## 2015-08-06 DIAGNOSIS — Z7901 Long term (current) use of anticoagulants: Secondary | ICD-10-CM

## 2015-08-06 DIAGNOSIS — K7031 Alcoholic cirrhosis of liver with ascites: Secondary | ICD-10-CM | POA: Diagnosis not present

## 2015-08-06 DIAGNOSIS — F1020 Alcohol dependence, uncomplicated: Secondary | ICD-10-CM

## 2015-08-06 DIAGNOSIS — F1021 Alcohol dependence, in remission: Secondary | ICD-10-CM

## 2015-08-06 DIAGNOSIS — I4891 Unspecified atrial fibrillation: Secondary | ICD-10-CM

## 2015-08-06 LAB — COMPREHENSIVE METABOLIC PANEL
ALK PHOS: 74 U/L (ref 38–126)
ALT: 14 U/L — AB (ref 17–63)
AST: 34 U/L (ref 15–41)
Albumin: 2.1 g/dL — ABNORMAL LOW (ref 3.5–5.0)
Anion gap: 11 (ref 5–15)
BILIRUBIN TOTAL: 2.7 mg/dL — AB (ref 0.3–1.2)
BUN: 13 mg/dL (ref 6–20)
CALCIUM: 8.6 mg/dL — AB (ref 8.9–10.3)
CHLORIDE: 103 mmol/L (ref 101–111)
CO2: 28 mmol/L (ref 22–32)
Creatinine, Ser: 1.01 mg/dL (ref 0.61–1.24)
Glucose, Bld: 162 mg/dL — ABNORMAL HIGH (ref 65–99)
Potassium: 3.4 mmol/L — ABNORMAL LOW (ref 3.5–5.1)
Sodium: 142 mmol/L (ref 135–145)
TOTAL PROTEIN: 6.3 g/dL — AB (ref 6.5–8.1)

## 2015-08-06 LAB — PROTIME-INR
INR: 1.62 — ABNORMAL HIGH (ref 0.00–1.49)
Prothrombin Time: 19.2 seconds — ABNORMAL HIGH (ref 11.6–15.2)

## 2015-08-06 LAB — LACTATE DEHYDROGENASE, PLEURAL OR PERITONEAL FLUID: LD, Fluid: 45 U/L — ABNORMAL HIGH (ref 3–23)

## 2015-08-06 LAB — CBC WITH DIFFERENTIAL/PLATELET
BASOS ABS: 0.1 10*3/uL (ref 0.0–0.1)
Basophils Relative: 1 %
EOS PCT: 1 %
Eosinophils Absolute: 0.1 10*3/uL (ref 0.0–0.7)
HEMATOCRIT: 31.5 % — AB (ref 39.0–52.0)
Hemoglobin: 10.8 g/dL — ABNORMAL LOW (ref 13.0–17.0)
LYMPHS PCT: 20 %
Lymphs Abs: 1.8 10*3/uL (ref 0.7–4.0)
MCH: 33.6 pg (ref 26.0–34.0)
MCHC: 34.3 g/dL (ref 30.0–36.0)
MCV: 98.1 fL (ref 78.0–100.0)
MONO ABS: 1.2 10*3/uL — AB (ref 0.1–1.0)
MONOS PCT: 14 %
Neutro Abs: 5.9 10*3/uL (ref 1.7–7.7)
Neutrophils Relative %: 64 %
PLATELETS: 307 10*3/uL (ref 150–400)
RBC: 3.21 MIL/uL — ABNORMAL LOW (ref 4.22–5.81)
RDW: 14.8 % (ref 11.5–15.5)
WBC: 9.1 10*3/uL (ref 4.0–10.5)

## 2015-08-06 LAB — BODY FLUID CELL COUNT WITH DIFFERENTIAL
EOS FL: NONE SEEN %
Lymphs, Fluid: 46 %
Monocyte-Macrophage-Serous Fluid: 46 % — ABNORMAL LOW (ref 50–90)
NEUTROPHIL FLUID: 8 % (ref 0–25)
Total Nucleated Cell Count, Fluid: 120 cu mm (ref 0–1000)

## 2015-08-06 LAB — GRAM STAIN

## 2015-08-06 LAB — PROTEIN, BODY FLUID

## 2015-08-06 LAB — ALBUMIN, FLUID (OTHER)

## 2015-08-06 LAB — GLUCOSE, SEROUS FLUID: Glucose, Fluid: 117 mg/dL

## 2015-08-06 MED ORDER — SPIRONOLACTONE 50 MG PO TABS: 50.0000 mg | ORAL_TABLET | Freq: Every day | ORAL | Status: DC

## 2015-08-06 NOTE — Patient Instructions (Signed)
Mark Stephens - -   Today you had a procedure called a paracentesis to take some of the fluid off of your abdomen.   To help keep the swelling down, and to help with the swelling in your arm and legs, please INCREASE your Spironolactone to 50mg  every day.  Please KEEP taking your lasix (furosemide) at the same dose.   Please make an appointment with any physician in 1-2 weeks from now to check how you are doing with the increased dose of spironolactone.    You have an appointment with me on September 24, 2015 at 11:15am.  I am not going to be in the clinic in May, so it is important that you keep this appointment if you can.   Thank you!  Paracentesis, Care After Refer to this sheet in the next few weeks. These instructions provide you with information about caring for yourself after your procedure. Your health care provider may also give you more specific instructions. Your treatment has been planned according to current medical practices, but problems sometimes occur. Call your health care provider if you have any problems or questions after your procedure. WHAT TO EXPECT AFTER THE PROCEDURE After your procedure, it is common to have a small amount of clear fluid coming from the puncture site. HOME CARE INSTRUCTIONS  Return to your normal activities as told by your health care provider. Ask your health care provider what activities are safe for you.  Take over-the-counter and prescription medicines only as told by your health care provider.  Do not take baths, swim, or use a hot tub for 1-2 weeks after the prcedure.  Follow instructions from your health care provider about:  How to take care of your puncture site - Please keep the bandage from today on for 24 hours.  There may be leakage from the site of clear liquid.  If there is no leakage, you can cover with a bandaid until it heals.    When and how you should change your bandage (dressing).  When you should remove your  dressing.  Check your puncture area every day signs of infection. Watch for:  Redness, swelling, or pain.  Fluid, blood, or pus.  Keep all follow-up visits as told by your health care provider. This is important. SEEK MEDICAL CARE IF:  You have redness, swelling, or pain at your puncture site.  You start to have more clear fluid coming from your puncture site.  You have blood or pus coming from your puncture site.  You have chills.  You have a fever. SEEK IMMEDIATE MEDICAL CARE IF:  You develop chest pain or shortness of breath.  You develop increasing pain, discomfort, or swelling in your abdomen.  You feel dizzy or light-headed or you pass out.   This information is not intended to replace advice given to you by your health care provider. Make sure you discuss any questions you have with your health care provider.   Document Released: 10/01/2014 Document Reviewed: 10/01/2014 Elsevier Interactive Patient Education [employer]

## 2015-08-06 NOTE — Progress Notes (Signed)
Subjective:    Patient ID: Mark Harman Sr., male    DOB: [birth year], 82 y.o.   MRN: [id number]  CC: Increased swelling, in hand and legs  HPI  Mark Stephens is a 82yo man with PMH of ETOH related cirrhosis of the liver, HTN, Afib, malnutrition who presents for his routine follow up.   He does have some acute issues which include increased swelling in the legs, hand swelling and lip swelling.  His wife is concerned that something may be going on.  The patient is alert and oriented, but distractible and somewhat confused.  He has been taking his diuretics and lactulose when he can, but is only having about 1 BM per day.  He has had issues with lightheadedness and dizziness on higher doses of diuretics, so he is on a rather small dose at this time.  Other issues include confusion at home, increased forgetfulness, pain with palpation of the liver, SOB particularly with lying flat.  His wife also notes that he has been weaker and requiring more help with ambulating.   He is taking his medications without issue, except does not always take lactulose as prescribed and he no longer takes allopurinol.    Review of Systems  Constitutional: Positive for fatigue. Negative for fever.       Weight gain of 17# since last visit  Respiratory: Positive for shortness of breath. Negative for cough and choking.   Cardiovascular: Positive for leg swelling. Negative for chest pain.  Gastrointestinal: Positive for abdominal pain and abdominal distention. Negative for diarrhea, blood in stool and anal bleeding.  Skin: Positive for color change (more erythema).  Neurological: Positive for dizziness and light-headedness.  Psychiatric/Behavioral: Positive for confusion and decreased concentration. Negative for behavioral problems.       Objective:   Physical Exam  Constitutional: He is oriented to person, place, and time. No distress.  Appears ill today, thin UE, anasarca  HENT:  Head: Normocephalic and  atraumatic.  Mouth/Throat: Oropharynx is clear and moist. No oropharyngeal exudate.  Eyes: Scleral icterus is present.  Neck: Neck supple.  Cardiovascular: Normal rate and regular rhythm.   No murmur heard. Pulmonary/Chest:  SOB with movement, SOB with lying flat.  No wheezes  Abdominal: Bowel sounds are normal. He exhibits distension. There is tenderness (over liver). There is no rebound.  Musculoskeletal: He exhibits edema and tenderness.  + palmar erythema  Lymphadenopathy:    He has no cervical adenopathy.  Neurological: He is alert and oriented to person, place, and time.  No asterixis noted  Psychiatric: His speech is delayed. He is slowed.   STAT CMET and INR Diagnostic tap Stephens by Mark Stephens initially, full paracentesis Stephens by me.   Paracentesis Procedure Note    INDICATION: Hepatic cirrhosis with ascites PROCEDURE OPERATOR: Mark Stephens Ultrasound used to mark location: N   CONSENT:  Consent was obtained from Mark Stephens along with Mark Stephens prior to the procedure. Indications, risks, and benefits were explained at length.    PROCEDURE SUMMARY:  A time-out was performed. My hands were washed immediately prior to the procedure.  The RLQ of the abdomen was cleansed and draped in usual sterile fashion using betadine scrub. Anesthesia was achieved with 1% lidocaine. 1% lidocaine was used to numb the skin, soft tissue and peritoneum. The paracentesis catheter was inserted and advanced with negative pressure until straw colored fluid was aspirated. Approximately 50 mL of ascitic fluid was collected and sent for laboratory analysis. The  catheter was then connected to the vaccutainer and 4 liters of additional ascitic fluid were drained. The catheter was removed and mild leaking was noted. A dressing was placed over the puncture wound. The patient tolerated the procedure well without any immediate complications. Estimated blood loss was 0mL.      Assessment & Plan:  RTC in  1 week, then 2 months

## 2015-08-07 LAB — PATHOLOGIST SMEAR REVIEW

## 2015-08-08 NOTE — Assessment & Plan Note (Addendum)
Main issue today.  MELD score 16.  Being referred to Liver specialist.   Continue diuretics - increase spironolactone - he is becoming anasarcic, may benefit from nutrition counseling.  Encouraged improved compliance with lactulose to have 2-3 BM per day.    Return visit in 1 week with one of the residents to check CMET and see how he is doing on higher dose of spironolactone.  He has not tolerated higher doses in the past well due to dizziness and lightheadedness and low BP.

## 2015-08-08 NOTE — Assessment & Plan Note (Signed)
He is no longer drinking per family.  When he asks for ETOH he is given grape juice.  Encouraged continued abstinence.

## 2015-08-08 NOTE — Assessment & Plan Note (Addendum)
Paracentesis today as noted.  I was concerned for SBP initially, but cell count was not above 250PMNs.  I think he is under-diuresed and likely not getting adequate nutrition.   Paracentesis today.  Will likely need paracentesis every 6-8 weeks going forward.    Increase spironolactone to 50mg  daily.

## 2015-08-08 NOTE — Assessment & Plan Note (Signed)
He is following with cardiology, on reduced dose of xarelto.  No bleeding noted per him or wife.   Continue current therapy with digoxin, metoprolol and xarelto.

## 2015-08-11 LAB — CULTURE, BODY FLUID-BOTTLE

## 2015-08-11 LAB — CULTURE, BODY FLUID W GRAM STAIN -BOTTLE: Culture: NO GROWTH

## 2015-08-19 ENCOUNTER — Other Ambulatory Visit: Payer: Self-pay | Admitting: Internal Medicine

## 2015-08-19 ENCOUNTER — Telehealth: Payer: Self-pay | Admitting: Internal Medicine

## 2015-08-19 NOTE — Telephone Encounter (Signed)
Called pharm, [facility] has been and is ready[facility]

## 2015-08-19 NOTE — Telephone Encounter (Signed)
APPT. REMINDER CALL, LMTCB °

## 2015-08-19 NOTE — Telephone Encounter (Signed)
Pt states spironolactone 50 mg is not at the pharmacy. Please call pt back.

## 2015-08-20 ENCOUNTER — Ambulatory Visit: Payer: Medicare Other | Admitting: Internal Medicine

## 2015-08-25 ENCOUNTER — Telehealth: Payer: Self-pay | Admitting: Internal Medicine

## 2015-08-25 NOTE — Telephone Encounter (Signed)
APPT. REMINDER CALL, LMTCB °

## 2015-08-27 ENCOUNTER — Ambulatory Visit (INDEPENDENT_AMBULATORY_CARE_PROVIDER_SITE_OTHER): Payer: Medicare Other | Admitting: Internal Medicine

## 2015-08-27 ENCOUNTER — Encounter: Payer: Self-pay | Admitting: Internal Medicine

## 2015-08-27 VITALS — BP 98/62 | HR 87 | Ht 71.5 in | Wt 153.7 lb

## 2015-08-27 DIAGNOSIS — G9340 Encephalopathy, unspecified: Secondary | ICD-10-CM | POA: Diagnosis not present

## 2015-08-27 DIAGNOSIS — R188 Other ascites: Secondary | ICD-10-CM | POA: Diagnosis not present

## 2015-08-27 DIAGNOSIS — E876 Hypokalemia: Secondary | ICD-10-CM | POA: Diagnosis not present

## 2015-08-27 LAB — COMPREHENSIVE METABOLIC PANEL WITH GFR
ALT: 14 U/L — ABNORMAL LOW (ref 17–63)
AST: 36 U/L (ref 15–41)
Albumin: 2.3 g/dL — ABNORMAL LOW (ref 3.5–5.0)
Alkaline Phosphatase: 66 U/L (ref 38–126)
Anion gap: 10 (ref 5–15)
BUN: 12 mg/dL (ref 6–20)
CO2: 31 mmol/L (ref 22–32)
Calcium: 8.4 mg/dL — ABNORMAL LOW (ref 8.9–10.3)
Chloride: 98 mmol/L — ABNORMAL LOW (ref 101–111)
Creatinine, Ser: 1.08 mg/dL (ref 0.61–1.24)
GFR calc Af Amer: >60 mL/min
GFR calc non Af Amer: >60 mL/min
Glucose, Bld: 144 mg/dL — ABNORMAL HIGH (ref 65–99)
Potassium: 2.9 mmol/L — ABNORMAL LOW (ref 3.5–5.1)
Sodium: 139 mmol/L (ref 135–145)
Total Bilirubin: 2.5 mg/dL — ABNORMAL HIGH (ref 0.3–1.2)
Total Protein: 6.1 g/dL — ABNORMAL LOW (ref 6.5–8.1)

## 2015-08-27 MED ORDER — POTASSIUM CHLORIDE ER 20 MEQ PO TBCR: 20.0000 meq | EXTENDED_RELEASE_TABLET | Freq: Two times a day (BID) | ORAL | Status: DC

## 2015-08-27 MED ORDER — RIFAXIMIN 550 MG PO TABS: 550.0000 mg | ORAL_TABLET | Freq: Two times a day (BID) | ORAL | Status: DC

## 2015-08-27 NOTE — Progress Notes (Signed)
Subjective:   Patient ID: Mark Harman Sr. male   DOB: 07-Jul-1933 82 y.o.   MRN: [id number]  HPI: [name] is a 82 y.o. 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 Patient seems to be at his baseline per his wife, although does seem quite confused, thinks he is in [place], unable to state the month, and makes intermittent strange comments throughout the visit. He takes lactulose prn to have 1-2 bowel movements per day. He has been tolerating the increased dose of Spironolactone, however, his blood pressure is slightly lower today. He admits to some very mild, intermittent lightheadedness, but otherwise feels well. Does have a very small amount of lower abdominal pain at times. During his last visit, he received a paracentesis and had 4L fluid removed. Weight is down ~10 lbs today since his previous visit. Abdomen is still quite tense, although patient does not note SOB or significant abdominal discomfort.   Of note: Patient with low oral temp of 92 on exam. Per wife, it is always difficult to get a temperature reading that is accurate. Think this is a spurious finding. Patient appears comfortable, does not feel hypothermic to the touch.   Past Medical History  Diagnosis Date  . Hypertension   . Heart murmur   . Atrial fibrillation (HCC) 11/27/14  . Dementia   . Dysrhythmia   . Liver cirrhosis (HCC)   . Cirrhosis of liver (HCC)   . Ascites    Current Outpatient Prescriptions  Medication Sig Dispense Refill  . allopurinol (ZYLOPRIM) 100 MG tablet Take 100 mg by mouth daily.      . digoxin (LANOXIN) 0.25 MG tablet TAKE ONE-HALF TO ONE TABLET BY MOUTH ONCE DAILY 90 tablet 3  . folic acid (FOLVITE) 400 MCG tablet Take 400 mcg by mouth daily.    . furosemide (LASIX) 20 MG tablet Take 40 mg by mouth daily.      Marland Kitchen lactulose (CHRONULAC) 10 GM/15ML solution Take 45 mLs (30 g total) by mouth 2 (two) times daily. 240 mL 3  . metoprolol (TOPROL-XL) 50 MG 24 hr tablet Take 50 mg by mouth daily.      . Rivaroxaban (XARELTO) 15 MG TABS tablet Take 1 tablet (15 mg total) by mouth daily. 30 tablet 6  . spironolactone (ALDACTONE) 50 MG tablet Take 1 tablet (50 mg total) by mouth daily. 30 tablet 1  . tamsulosin (FLOMAX) 0.4 MG CAPS capsule TAKE 1 CAPSULE BY MOUTH ONCE A DAY 90 capsule 1   No current facility-administered medications for this visit.    Review of Systems: General: Positive for fatigue. Denies fever, chills, diaphoresis.  Respiratory: Denies SOB, DOE, cough, and wheezing.   Cardiovascular: Denies chest pain and palpitations.  Gastrointestinal: Positive for mild, intermittent abdominal discomfort. Denies nausea, vomiting, and diarrhea.  Genitourinary: Denies dysuria, increased frequency, and flank pain. Endocrine: Denies hot or cold intolerance, polyuria, and polydipsia. Musculoskeletal: Denies myalgias, back pain, joint swelling, arthralgias and gait problem.  Skin: Denies pallor, rash and wounds.  Neurological: Denies dizziness, seizures, syncope, weakness, lightheadedness, numbness and headaches.  Psychiatric/Behavioral: Denies mood changes, and sleep disturbances.  Objective:   Physical Exam: Filed Vitals:   08/27/15 0954  BP: 98/62  Pulse: 87  Height: 5' 11.5" (1.816 m)  Weight: 153 lb 11.2 oz (69.718 kg)  SpO2: 98%  General: Elderly male, alert, cooperative, NAD. Answers questions appropriately, although slightly confused.  HEENT: PERRL, EOMI. Moist mucus membranes Neck: Full range of motion without pain, supple, no lymphadenopathy or carotid bruits Lungs: Clear to ascultation bilaterally, normal work of respiration, no wheezes, rales, rhonchi Heart: RRR, no murmurs, gallops, or rubs Abdomen: Ascitic, distended, tense, fluid wave present. Umbilical hernia. BS +.  Penile/scrotal edema Extremities: No cyanosis, clubbing. +3 pitting edema in LE's bilaterally.  Neurologic: Alert & oriented x1, cranial nerves II-XII intact, strength grossly intact, sensation intact to light touch   Assessment & Plan:   Please see problem based assessment and plan.

## 2015-08-27 NOTE — Patient Instructions (Signed)
1. Please make a follow up appointment for 1 week for likely repeat paracentesis.   2. Please take all medications as previously prescribed with the following changes:  Start Rifaximin 550 mg twice daily. This will help with encephalopathy.   Continue Spironolactone 50 mg daily as well as other previously prescribed medications.   3. If you have worsening of your symptoms or new symptoms arise, please call the clinic [phone]), or go to the ER immediately if symptoms are severe.

## 2015-08-28 NOTE — Assessment & Plan Note (Addendum)
Had paracentesis at last clinic visit on 08/08/15. Weight at that time prior to 4L para was 161 lbs. Today he is 153. Abdomen is tense, distended, fluid wave present. Breathing comfortably currently, only mild, infrequent abdominal discomfort. Do not think he needs paracentesis today, although think he will need this soon. BMP shows normal Cr, K of 2.9. Please see "hypokalemia" A&P.  -Continue Lasix 40 mg daily + Spironolactone 50 mg daily. Do not think his BP will tolerate increase in diuresis.  -RTC in 1 week for likely paracentesis.

## 2015-08-28 NOTE — Assessment & Plan Note (Signed)
Unable to state the appropriate date or time, thinks we are in [place]. Per the wife, this is his baseline. She also states he makes strange comments at home, "marks" things in the yard, wife does not know what this means. Also states he sometimes hold liquid in his mouth and forgets to swallow for a short time. Unclear if this is purely related to his liver disease or dementia given his family history significant for Alzheimer's. Most likely a combination of the two. Takes lactulose prn for 1-2 bowel movements daily.  -Add Rifaximin 550 mg bid

## 2015-08-28 NOTE — Progress Notes (Signed)
Internal Medicine Clinic Attending  Case discussed with Dr. Jones soon after the resident saw the patient.  We reviewed the resident's history and exam and pertinent patient test results.  I agree with the assessment, diagnosis, and plan of care documented in the resident's note. 

## 2015-08-28 NOTE — Assessment & Plan Note (Signed)
K 2.9 today, despite recently increased dose of Spironolactone. Cr normal, 1.08 (baseline).  -Start K-dur 20 mg bid -RTC in 1 week, repeat BMP at that time.

## 2015-09-02 ENCOUNTER — Telehealth: Payer: Self-pay | Admitting: Internal Medicine

## 2015-09-02 NOTE — Telephone Encounter (Signed)
APPT. REMINDER CALL, LMTCB °

## 2015-09-03 ENCOUNTER — Ambulatory Visit (INDEPENDENT_AMBULATORY_CARE_PROVIDER_SITE_OTHER): Payer: Medicare Other | Admitting: Internal Medicine

## 2015-09-03 ENCOUNTER — Encounter: Payer: Self-pay | Admitting: Internal Medicine

## 2015-09-03 VITALS — Temp 97.3°F | Ht 71.0 in | Wt 155.9 lb

## 2015-09-03 DIAGNOSIS — G8929 Other chronic pain: Secondary | ICD-10-CM | POA: Diagnosis not present

## 2015-09-03 DIAGNOSIS — M25551 Pain in right hip: Secondary | ICD-10-CM

## 2015-09-03 DIAGNOSIS — K7031 Alcoholic cirrhosis of liver with ascites: Secondary | ICD-10-CM

## 2015-09-03 DIAGNOSIS — G9340 Encephalopathy, unspecified: Secondary | ICD-10-CM

## 2015-09-03 DIAGNOSIS — E43 Unspecified severe protein-calorie malnutrition: Secondary | ICD-10-CM

## 2015-09-03 NOTE — Patient Instructions (Signed)
Paracentesis, Care After °Refer to this sheet in the next few weeks. These instructions provide you with information about caring for yourself after your procedure. Your health care provider may also give you more specific instructions. Your treatment has been planned according to current medical practices, but problems sometimes occur. Call your health care provider if you have any problems or questions after your procedure. °WHAT TO EXPECT AFTER THE PROCEDURE °After your procedure, it is common to have a small amount of clear fluid coming from the puncture site. °HOME CARE INSTRUCTIONS °· Return to your normal activities as told by your health care provider. Ask your health care provider what activities are safe for you. °· Take over-the-counter and prescription medicines only as told by your health care provider. °· Do not take baths, swim, or use a hot tub until your health care provider approves. °· Follow instructions from your health care provider about: °¨ How to take care of your puncture site. °¨ When and how you should change your bandage (dressing). °¨ When you should remove your dressing. °· Check your puncture area every day signs of infection. Watch for: °¨ Redness, swelling, or pain. °¨ Fluid, blood, or pus. °· Keep all follow-up visits as told by your health care provider. This is important. °SEEK MEDICAL CARE IF: °· You have redness, swelling, or pain at your puncture site. °· You start to have more clear fluid coming from your puncture site. °· You have blood or pus coming from your puncture site. °· You have chills. °· You have a fever. °SEEK IMMEDIATE MEDICAL CARE IF: °· You develop chest pain or shortness of breath. °· You develop increasing pain, discomfort, or swelling in your abdomen. °· You feel dizzy or light-headed or you pass out. °  °This information is not intended to replace advice given to you by your health care provider. Make sure you discuss any questions you have with your health  care provider. °  °Document Released: 10/01/2014 Document Reviewed: 10/01/2014 °Elsevier Interactive Patient Education ©2016 Elsevier Inc. ° °

## 2015-09-03 NOTE — Assessment & Plan Note (Addendum)
Pt p/w his wife for f/u of ascites.  He has not been using the lactulose consistently and has not titrated to 3 BM per day.  He did also not pick up the rafaximin due to cost.  He has been compliant with spironolactone 50mg  and lasix 40mg  daily.  He denies any dyspnea or abdominal pain.  However, his wife reports that he complains of pain and requests a pain pill.  Unfortunately, she has been giving him ibuprofen 600mg  at least daily for pain.  She has also been giving him percogesic which contains benadryl and acetaminophen.  I cautioned her and advised not to use ibuprofen or percogesic.  Benadryl may also been contributing to increased confusion.  On exam, he has anasarca with a distended, tense abdomen and a positive fluid wave.  He is alert and oriented x2.  He answers questions appropriately.   -therapeutic paracentesis today -reminded the wife to d/c ibuprofen  -avoid benadryl or other sedating/anticholingeric meds -return precautions given

## 2015-09-03 NOTE — Assessment & Plan Note (Signed)
The wife reports that he has some stage 1 pressure ulcer over the right hip that has healed today but she is concerned because he tends to lay on that side. -advised her to have him change positions often to avoid putting too much pressure on any one area -unclear if he may qualify for an air mattress but this may be investigated?

## 2015-09-03 NOTE — Progress Notes (Signed)
Patient ID: Mark Stephens., male   DOB: 1933-07-09, 82 y.o.   MRN: [id number]     Subjective:   Patient ID: Mark Stephens. male    DOB: 1933-07-09 82 y.o.    MRN: [id number] Health Maintenance Due: Health Maintenance Due  Topic Date Due  . TETANUS/TDAP  04/25/1955  . ZOSTAVAX  04/24/1996  . PNA vac Low Risk Adult (2 of 2 - PCV13) 03/12/2015    _________________________________________________  HPI: Mark Stephens. is a 82 y.o. male here for management of ascites.  Pt has a PMH outlined below.  Please see problem-based charting assessment and plan for further status of patient's chronic medical problems addressed at today's visit.  PMH: Past Medical History  Diagnosis Date  . Hypertension   . Heart murmur   . Atrial fibrillation (HCC) 11/27/14  . Dementia   . Dysrhythmia   . Liver cirrhosis (HCC)   . Cirrhosis of liver (HCC)   . Ascites     Medications: Current Outpatient Prescriptions on File Prior to Visit  Medication Sig Dispense Refill  . allopurinol (ZYLOPRIM) 100 MG tablet Take 100 mg by mouth daily.      . digoxin (LANOXIN) 0.25 MG tablet TAKE ONE-HALF TO ONE TABLET BY MOUTH ONCE DAILY 90 tablet 3  . folic acid (FOLVITE) 400 MCG tablet Take 400 mcg by mouth daily.    . furosemide (LASIX) 20 MG tablet Take 40 mg by mouth daily.    Marland Kitchen. lactulose (CHRONULAC) 10 GM/15ML solution Take 45 mLs (30 g total) by mouth 2 (two) times daily. 240 mL 3  . metoprolol (TOPROL-XL) 50 MG 24 hr tablet Take 50 mg by mouth daily.      . potassium chloride 20 MEQ TBCR Take 20 mEq by mouth 2 (two) times daily. 60 tablet 1  . rifaximin (XIFAXAN) 550 MG TABS tablet Take 1 tablet (550 mg total) by mouth 2 (two) times daily. 60 tablet 2  . Rivaroxaban (XARELTO) 15 MG TABS tablet Take 1 tablet (15 mg total) by mouth daily. 30 tablet 6  . spironolactone (ALDACTONE) 50 MG tablet Take 1 tablet (50 mg total) by mouth daily. 30 tablet 1  . tamsulosin (FLOMAX) 0.4 MG CAPS capsule  TAKE 1 CAPSULE BY MOUTH ONCE A DAY 90 capsule 1   No current facility-administered medications on file prior to visit.    Allergies: No Known Allergies  FH: Family History  Problem Relation Age of Onset  . Hypertension Mother   . Diabetes Mother   . Alzheimer's disease Mother     SH: Social History   Social History  . Marital Status: Married    Spouse Name: N/A  . Number of Children: N/A  . Years of Education: N/A   Social History Main Topics  . Smoking status: Never Smoker   . Smokeless tobacco: Never Used  . Alcohol Use: No  . Drug Use: No  . Sexual Activity: Not Asked   Other Topics Concern  . None   Social History Narrative    Review of Systems: Constitutional: Negative for fever, chills, +weight loss.   Respiratory: Negative for shortness of breath.  Cardiovascular: +leg swelling.  Gastrointestinal: +abdominal pain.   Objective:   Vital Signs: Filed Vitals:   09/03/15 1531  Temp: 97.3 F (36.3 C)  TempSrc: Axillary  Height: 5\' 11"  (1.803 m)  Weight: 155 lb 14.4 oz (70.716 kg)  SpO2: 100%      BP Readings from Last 3  Encounters:  08/27/15 98/62  08/06/15 108/66  06/18/15 105/68    Physical Exam: Constitutional: Vital signs reviewed.  Patient is in NAD and cooperative with exam.  Head: Normocephalic and atraumatic. Eyes: EOMI, conjunctivae nl, no scleral icterus.  Cardiovascular: RRR, murmur.  Pulmonary/Chest: normal effort, CTAB. Abdominal: Distended, +fluid wave, decreased BS.  Mild diffuse tenderness.  Neurological: A&O x2. Extremities: 2+pitting edema b/l.  ________________________________________________  PROCEDURE:  Therapeutic Paracentesis.  INDICATION:  Worsening ascites.  PROCEDURE OPERATOR: Boykin Peek, MD supervised by Debe Coder, MD.  CONSENT:  Informed consent was obtained after risks and benefits were explained at length.   PROCEDURE SUMMARY:  A time-out was performed. The area of the RIGHT abdomen was  prepped and draped in a sterile fashion using betadine. 1% lidocaine was used to numb the region. The skin was incised 1.5 mm using a 10 blade scalpel. The paracentesis catheter was inserted and advanced with negative pressure. No blood was aspirated. Clear yellow fluid was retrieved.  The catheter was then connected to the vaccutainer and 4 liters of additional ascitic fluid were drained. The catheter was removed with minimal leaking noted. The patient tolerated the procedure well without any immediate complications. Dr. Debe Coder was present during the procedure.  ESTIMATED BLOOD LOSS:  Minimal.    Assessment & Plan:   Assessment and plan was discussed and formulated with my attending.

## 2015-09-03 NOTE — Assessment & Plan Note (Signed)
-  avoid percogesic and other anticholinergics as this may increase confusion -avoid ibuprofen

## 2015-09-03 NOTE — Assessment & Plan Note (Signed)
-  advised to obtain additional protein in shakes, etc.

## 2015-09-05 NOTE — Addendum Note (Signed)
Addended by: Debe CoderMULLEN, EMILY B on: 09/05/2015 09:51 AM   Modules accepted: Level of Service

## 2015-09-05 NOTE — Progress Notes (Signed)
Internal Medicine Clinic Attending  I saw and evaluated the patient.  I personally confirmed the key portions of the history and exam documented by Dr. Gill and I reviewed pertinent patient test results.  The assessment, diagnosis, and plan were formulated together and I agree with the documentation in the resident's note. 

## 2015-09-15 ENCOUNTER — Telehealth: Payer: Self-pay | Admitting: *Deleted

## 2015-09-15 NOTE — Telephone Encounter (Signed)
Thank you.  [name] chart does not reflect a payor source for PCS.  However, if he is homebound we can ask Dr. Delane Stephens (saw him on 09/03/15)/Dr. Criselda Stephens for home health orders and palliative care c/s.  I don't see him scheduled until next week 4/26.

## 2015-09-15 NOTE — Telephone Encounter (Signed)
[facility] and states pt's caregiver is by herself and is in need of assistance, pt is getting worse and has some pressure areas, he will see dr Criselda Peachesmullen this week. She would like a pallative care referral and possibly other suggestions, maybe a Industrial/product designerpcs worker Sending to [facility]. And dr Criselda Peachesmullen

## 2015-09-19 NOTE — Telephone Encounter (Signed)
Thanks

## 2015-09-19 NOTE — Telephone Encounter (Signed)
Home Based Palliative Care can be discussed during [facility].  Thanks, Edson SnowballShana

## 2015-09-19 NOTE — Telephone Encounter (Signed)
I will work on this when I get back from vacation next week.  I will be in clinic all week next week.  In the interim, are there any orders I can put in today to get things moving?  Thanks!

## 2015-09-19 NOTE — Telephone Encounter (Signed)
Dr. Criselda PeachesMullen, you were the attending during Dr. Shiela MayerGill's appointment. If you would like to place a [facility] RN and [facility] Aide order today, we can get that started.

## 2015-09-22 ENCOUNTER — Telehealth: Payer: Self-pay | Admitting: Internal Medicine

## 2015-09-22 NOTE — Telephone Encounter (Signed)
APPT. REMINDER CALL, LMTCB °

## 2015-09-24 ENCOUNTER — Ambulatory Visit (INDEPENDENT_AMBULATORY_CARE_PROVIDER_SITE_OTHER): Payer: Medicare Other | Admitting: Internal Medicine

## 2015-09-24 ENCOUNTER — Encounter: Payer: Self-pay | Admitting: Licensed Clinical Social Worker

## 2015-09-24 VITALS — BP 94/60 | HR 68 | Wt 146.0 lb

## 2015-09-24 DIAGNOSIS — K7031 Alcoholic cirrhosis of liver with ascites: Secondary | ICD-10-CM | POA: Diagnosis not present

## 2015-09-24 DIAGNOSIS — K7290 Hepatic failure, unspecified without coma: Secondary | ICD-10-CM | POA: Diagnosis not present

## 2015-09-24 DIAGNOSIS — K7210 Chronic hepatic failure without coma: Secondary | ICD-10-CM | POA: Insufficient documentation

## 2015-09-24 MED ORDER — HYDROXYZINE HCL 50 MG PO TABS: 50.0000 mg | ORAL_TABLET | Freq: Three times a day (TID) | ORAL | Status: DC | PRN

## 2015-09-24 MED ORDER — HYDROXYZINE HCL 50 MG PO TABS: 50.0000 mg | ORAL_TABLET | Freq: Three times a day (TID) | ORAL | Status: AC | PRN

## 2015-09-24 NOTE — Patient Instructions (Addendum)
Mr. Mark OverlieHolland - -   For your wound, you had a bandage placed today which should be left on for 5-7 days.  On the day of changing, wipe gently with sterile cloth (provided) and sterile water (provided) and place a new bandage for 5-7 days.  You will also have a home health Wound Care Nurse who should contact you.  With nursing, you will also have the opportunity to have an Aide.   I am going to refer [facility] for more assistance and guidance through this difficult time.   Please stop the following medications  Allopurinol Digoxin Xarelto Metoprolol.   You can continue taking the diuretics to help with the volume overload.   Try Hydroxyzine for anxiety and to help with sleep.   Please make an appointment with any doctor in 2-3 weeks to have a paracentesis done.    Please call anytime if you should need anything.    Thank you

## 2015-09-24 NOTE — Progress Notes (Signed)
   Subjective:    Patient ID: Mark Guess Sr., male    DOB: Oct 06, 1933, 82 y.o.   MRN: [id number]  CC: 3 week follow up for hepatic cirrhosis.   HPI  Mark Stephens is a 72 yo man with hepatic cirrhosis due to ETOH and ascites.  He has a paracentesis about 3 weeks ago.  He and his wife Mark Stephens came in today to discuss personal care services.  Mark Stephens tells me that Mark Stephens is no longer eating, he is not walking well and is getting weaker.  He has had a wound over the right hip which is worsening.  He also seems more confused and has pretty much refused to take his lactulose.  Objectively today he has muscle wasting and is very weak.  Mark Stephens has very little social support in the family.  Mark Stephens has 1 son who does not come over much because of the difficult nature of his father's disease.    We spent the majority of the visit discussing options for in home care to include home health with nursing and possibly an aide, palliative care services or hospice.  I strongly advised her to start with hospice.  Mark Stephens has progressively declined since I have met him last October.  He has become progressively more confused, weakened, has lost significant weight.  He has a BP which is difficult to maintain.  He has persistent ascites requiring paracentesis every 6-8 weeks.  He now has a pressure wound based on his preferred side to lay on in the bed.    I also discussed with Mark Stephens about medications that should be stopped.  We have decided to stop all but his diuretics, and if she can convince him to take it, his lactulose.  They were not able to afford Rifaximin.  I believe Mark Stephens is in the final stages of his disease.   > 50% of my face to face time with Mark Stephens and Mark Stephens was done in communication of situation and counseling.     Review of Systems  Constitutional: Positive for activity change and appetite change. Negative for fever and unexpected weight change.  Skin: Positive for wound.  Neurological: Positive for weakness.  Negative for syncope.  Psychiatric/Behavioral: Positive for confusion and agitation.       Objective:   Physical Exam  Constitutional:  Cachetic, temporal wasting.  Fatigued.   Abdominal:  + Ascites, with distention, not tense.   Skin:  Wound over right greater trochanter, quarter in size, unstageable as there is white debris in wound.  Erythema surrounding, no drainage.  Does not appear infected.        Assessment & Plan:     Time spent: 30 minutes

## 2015-09-24 NOTE — Progress Notes (Signed)
PCP discussed referral to hospice with Mark Stephens and she is agreeable.  CSW met with Mark Stephens following their scheduled [facility] appointment.  Mark Stephens is familiar with hospice service and would like an agency in the [facility] area.  Pt/family provided with information from [facility] and [facility].  Family also inquired about additional in-home services.  CSW discussed private pay companion/respite care, personal care services and the differences between them.  Brochures on agencies provided as well as Psychologist, sport and exercise for [employer].

## 2015-09-24 NOTE — Assessment & Plan Note (Signed)
[name] has end stage liver disease.    Cease all medications except diuretics Hospice consult, will need hospital bed, wound care, etc.  Wound was dressed by nursing today and wound care instructions given to [facility] is assisting with referral to Hospice Paracentesis may need to be done in next 2-3 weeks.  I advised Mark Stephens to make an appointment.  However, I am interested if Hospice would advise against this procedure given his state.  Will be in communication with the providers.  Mark Stephens also noted lack of sleep and anxiety when she is not around.  Trial of hydroxyzine.  Hopefully hospice will also address this common complaint at the end of life.

## 2015-09-26 ENCOUNTER — Telehealth: Payer: Self-pay | Admitting: *Deleted

## 2015-09-26 DIAGNOSIS — K7290 Hepatic failure, unspecified without coma: Secondary | ICD-10-CM

## 2015-09-26 DIAGNOSIS — K7210 Chronic hepatic failure without coma: Secondary | ICD-10-CM

## 2015-09-26 MED ORDER — LORAZEPAM 0.5 MG PO TABS: 0.5000 mg | ORAL_TABLET | ORAL | Status: AC | PRN

## 2015-09-26 MED ORDER — HYDROCODONE-ACETAMINOPHEN 10-325 MG PO TABS: 1.0000 | ORAL_TABLET | Freq: Four times a day (QID) | ORAL | Status: DC | PRN

## 2015-09-26 NOTE — Telephone Encounter (Signed)
Call from [place], nurse with [facility] - states pt needs something for pain/esp when moving, #8, esp over right hip - suggest Hydrocodone which can be faxed to the pharmacy,states to write"Hospice Patient" on the bottom of the rx. Also needs something for anxiety - suggests Ativan. Also states O2 level has been between 79-81%; ok to put pt on oxygen. [facility]xs to [facility] [place].

## 2015-09-26 NOTE — Telephone Encounter (Signed)
Ativan and Hydrocod[facility] rxs [facility]ed [facility]. Pt's wife and Boneta LucksJenny, hospice nurse made awared.

## 2015-09-26 NOTE — Telephone Encounter (Signed)
Ordered all three.  [facility]  Spoke to [employer] about DME order for O2.

## 2015-09-29 ENCOUNTER — Telehealth: Payer: Self-pay

## 2015-09-29 NOTE — Telephone Encounter (Signed)
Dr Criselda Peachesmullen, hospice nurse called and states pt is very distended, abd girth 40 ins this am, his wife would like for him to have another paracentesis but she would like for you to arrange for it to be done [facility], i explained that the internal medicine dept does not see [facility] but would send you a message and we would call her back in the am Please advise

## 2015-09-29 NOTE — Telephone Encounter (Signed)
Hospice nurse calls and states that she saw pt today and abd was very distended, measured at 40ins

## 2015-09-29 NOTE — Telephone Encounter (Signed)
Please call back Rosey Batheresa from [facility].

## 2015-09-30 ENCOUNTER — Telehealth: Payer: Self-pay

## 2015-09-30 NOTE — Telephone Encounter (Signed)
Mr. Marcelle OverlieHolland would need to present to the ED to have it done [facility].  I think that is preferable given they have to travel to [place] and I do not think he would be very able to do that.  Can you discuss with hospice nurse and see what they can facilitate with getting him to the ED for therapeutic paracentesis.  They should be able to do it in the ED and not have to admit him.    Thanks  EBM

## 2015-09-30 NOTE — Telephone Encounter (Signed)
Place order for "Ambulatory referral to IR" with comment to preform paracentesis.  [facility] will see order and coordinate with [facility] to schedule.  LVM for wife to take pt to ED in the case he becomes uncomfortable or cannot wait to be scheduled.  Being scheduled is in an effort to avoid waiting in the ED, being stuck for labs, etc per the hospice RN at [place], they CAN preform if needed.

## 2015-09-30 NOTE — Telephone Encounter (Signed)
i spoke w/ [facility] this am and gave her dr Dana Corporationmullen's message, she states she will start the process, she will contact the [facility] hospice liaison to make arrangements She was also told that anything the pt or his wife needs to just call and ask and we will assist

## 2015-09-30 NOTE — Telephone Encounter (Signed)
Called [facility], she states [facility] liason has advised they d not have an ED doctor that can preform a paracentesis, they typically schedule them out into the OP special procedures.  They need an order for OP paracentesis special procedures put in to move forward with scheduling.

## 2015-09-30 NOTE — Telephone Encounter (Signed)
Please call back [facility].

## 2015-10-01 ENCOUNTER — Other Ambulatory Visit: Payer: Self-pay | Admitting: Internal Medicine

## 2015-10-01 DIAGNOSIS — K7031 Alcoholic cirrhosis of liver with ascites: Secondary | ICD-10-CM

## 2015-10-01 NOTE — Telephone Encounter (Signed)
Sounds good to me. Thanks!

## 2015-10-01 NOTE — Telephone Encounter (Signed)
Rosey B[facility]resa from [facility] called to report pt wife now wants to come to clinic for paracentesis, they prefer tomorrow.  I have notified PCP and added to a 90 minute slot for tomorrow afternoon with R. Allena KatzPatel.

## 2015-10-02 ENCOUNTER — Encounter: Payer: Self-pay | Admitting: Internal Medicine

## 2015-10-02 ENCOUNTER — Ambulatory Visit (INDEPENDENT_AMBULATORY_CARE_PROVIDER_SITE_OTHER): Admitting: Internal Medicine

## 2015-10-02 VITALS — BP 114/55 | HR 98 | Temp 97.5°F | Ht 71.0 in | Wt 144.5 lb

## 2015-10-02 DIAGNOSIS — K7031 Alcoholic cirrhosis of liver with ascites: Secondary | ICD-10-CM | POA: Diagnosis not present

## 2015-10-02 NOTE — Assessment & Plan Note (Addendum)
Overview He presents today with his wife for worsening abdominal distention in the setting of ascites. He is now on hospice care, so this procedure is of palliative benefit to the patient. His wife reports that he has been taking spironolactone 50 mg daily along with Lasix 36m.  Assessment Ascites in the setting of of alcoholic cirrhosis  Plan -Performed therapeutic paracentesis today [see procedure note below] -Advised wife to increase spironolactone 100 mg daily on Monday if patient feels well and to call [place] so that we may adjust his medications accordingly to which she acknowledged understanding  Procedure Note Patient was positioned supine on the table. Percussion was used to locate an area of ascites. A time-out was completed, verifying correct patient, procedure, site, positioning, and implant(s) or special equipment if applicable. Patient was positioned, prepped, and draped in the usual sterile fashion. 1% Lidocaine was used to anesthetize the area. A standard paracentesis kit needle was introduced into the peritoneal space and 6000 mL clear yellow was removed. No complications. Blood loss was minimal. Dr. KEppie Gibsonwas present/immediately available during the entire procedure.

## 2015-10-02 NOTE — Progress Notes (Signed)
   Subjective:    Patient ID: Arlyce Harmanobert L Levinson Sr., male    DOB: 22-Jul-1933, 82 y.o.   MRN: [id number]  HPI Mr. 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 Please see assessment & plan for status of chronic medical problems.    Review of Systems  Gastrointestinal: Positive for abdominal distention.  Skin: Positive for color change.  Psychiatric/Behavioral: Positive for confusion.       Objective:   Physical Exam  Constitutional: He appears well-developed. No distress.  Chronic ill-appearing white male.  HENT:  Head: Normocephalic and atraumatic.  Eyes: Scleral icterus is present.  Cardiovascular: Regular rhythm.   tachycardic  Pulmonary/Chest: Effort normal. No respiratory distress.  Abdominal: He exhibits distension.  Musculoskeletal: He exhibits edema (2+ pitting edema bilaterally to the knees).  Neurological: He is alert.  Oriented to name, year but not place ([place] [facility] [place])  Skin: Skin is warm and dry. He is not diaphoretic.          Assessment & Plan:

## 2015-10-02 NOTE — Patient Instructions (Signed)
If he continues to feel well until Monday, please take two tablets of spironolactone instead of one AND call the clinic back to let them know.  Please give us a call a few days in advance to make sure we can get him scheduled.  Thank you.

## 2015-10-03 NOTE — Telephone Encounter (Signed)
This encounter was created in error - please disregard.

## 2015-10-06 ENCOUNTER — Telehealth: Payer: Self-pay | Admitting: Internal Medicine

## 2015-10-06 DIAGNOSIS — K7031 Alcoholic cirrhosis of liver with ascites: Secondary | ICD-10-CM

## 2015-10-06 NOTE — Telephone Encounter (Signed)
I tried calling the patient's wife with regards to increasing his spironolactone dose today to 100 mg if he is doing well. Though I was under the impression he was on Lasix 20 mg, he is actually on Lasix 40 mg daily, and an increased dose of spironolactone would only help with preventing hypokalemia typically associated with loop diuretic use. Unfortunately, I was unable to reach her on 2 separate attempts, so I left a voicemail instructing her to give us call back.

## 2015-10-07 MED ORDER — SPIRONOLACTONE 100 MG PO TABS
100.0000 mg | ORAL_TABLET | Freq: Every day | ORAL | Status: AC
Start: 2015-10-07 — End: 2016-10-06

## 2015-10-07 NOTE — Addendum Note (Signed)
Addended by: Beather ArbourPATEL, RUSHIL V on: 10/07/2015 11:39 AM   Modules accepted: Orders, Medications

## 2015-10-07 NOTE — Telephone Encounter (Signed)
Patient's wife returned your call this morning and had quite a few questions about the spironolactone and Lasix. She agreed to be available for your call. Thanks!

## 2015-10-07 NOTE — Telephone Encounter (Signed)
I spoke with the wife just now. Hospice RN was also at home.  His wife confirmed he has been doing well since the paracentesis last week. They have 8 tablets left of spironolactone 50 mg, so I instructed her to give him 2 tablets daily until they run out. I conveyed a verbal order to the hospice RN for spironolactone 100 mg daily to minimize pill burden.   They thanked me for my time and acknowledged understanding of the new instructions.  After hanging up, I realized potassium supplementation was still on the [facility]. I spoke with his wife who acknowledges that he was no longer taking potassium supplementation. I thus discontinued it off the medication list.

## 2015-10-08 NOTE — Progress Notes (Signed)
I saw and evaluated the patient.  I personally confirmed the key portions of Dr. Eliane DecreePatel's history and exam and reviewed pertinent patient test results.  The assessment, diagnosis, and plan were formulated together and I agree with the documentation in the resident's note.  I was present at the bedside for the entire paracentesis procedure.  The patient tolerated the procedure well.  With the reaccumulation of the ascitic fluid further titration of the diuretics, particularly the spironolactone is appropriate.  Further titration of the diuretics should occur as appropriate in hopes of limiting his need for therapeutic paracenteses.

## 2015-10-14 ENCOUNTER — Telehealth: Payer: Self-pay | Admitting: *Deleted

## 2015-10-14 NOTE — Telephone Encounter (Signed)
Visit today: BP 96/56 HR 66, irregular Left hand is very swollen, non red, very white, almost opaque, not up arm, pt lays on R side all the time Hospice nurse suggest fentanyl patch and haloperidol maybe starting the haloperidol a few days after pt starts fentanyl so as to not overload him at once Needs fentanyl asap

## 2015-10-15 MED ORDER — HALOPERIDOL 0.5 MG PO TABS: 0.5000 mg | ORAL_TABLET | Freq: Three times a day (TID) | ORAL | Status: DC | PRN

## 2015-10-15 MED ORDER — FENTANYL 25 MCG/HR TD PT72: 25.0000 ug | MEDICATED_PATCH | TRANSDERMAL | Status: DC

## 2015-10-15 NOTE — Telephone Encounter (Signed)
Agree with plan to start fentanyl patch.  How can I get this to them?   Thanks

## 2015-10-15 NOTE — Telephone Encounter (Signed)
Spoke w/ hospice, will fax the scripts to [facility] [address] [facility] [place],  [phone]

## 2015-10-15 NOTE — Telephone Encounter (Signed)
[facility]

## 2015-10-15 NOTE — Telephone Encounter (Signed)
Have written following Rx  Fentanyl 6025mcg/hr patch, change every 72hr.  Dispense #10 Haldol 0.5mg  PO TID PRN dispense #90  Please have hospice call back if these are not adequate.    Thank you

## 2015-10-15 NOTE — Telephone Encounter (Signed)
Will need to write and print as a prescription instead of pt picking up we will write hospice pt on script and be able to fax it to pharm, i will also call the hospice nurse and give her the order, if you want to do the haldol now to start in about 3 to 6 days we can do that and tell them not to fill till then just in case they need it rather quickly and by then it will be the weekend, just let me know what you want and i will even come pick it up from you if you need, i will bring you a script pad if that helps

## 2015-10-21 ENCOUNTER — Telehealth: Payer: Self-pay | Admitting: *Deleted

## 2015-10-21 MED ORDER — HYDROCODONE-ACETAMINOPHEN 10-325 MG PO TABS: 1.0000 | ORAL_TABLET | Freq: Four times a day (QID) | ORAL | Status: DC | PRN

## 2015-10-21 MED ORDER — HALOPERIDOL 1 MG PO TABS: 1.0000 mg | ORAL_TABLET | Freq: Three times a day (TID) | ORAL | Status: DC | PRN

## 2015-10-21 MED ORDER — FENTANYL 50 MCG/HR TD PT72: 50.0000 ug | MEDICATED_PATCH | TRANSDERMAL | Status: DC

## 2015-10-21 NOTE — Telephone Encounter (Signed)
RN calls and states sig other has had to use vicodin 3 times in the last 24hrs, states the haldol keys pt up instead of calming, would like to d'c haldol and possibly use seroquel or try increasing haldol to see if it might calm. Also would like to increase the fentanyl patch to make use of vicodin less. Please advise

## 2015-10-21 NOTE — Telephone Encounter (Signed)
Spoke to [place] to inform, [facility]

## 2015-10-21 NOTE — Telephone Encounter (Signed)
Hospice nurse called back and pt will need a refill on the norco sent to [facility] [address] in [place]

## 2015-10-21 NOTE — Telephone Encounter (Signed)
Have written Rx for higher dose of fentanyl, 50mcg to skin q 72 hours, 10 patches; Have refilled hydrocodone/tylenol at previous dose.   Have also written Rx for 1mg  of haldol for a trial to see if this helps.   Can you update Hospice nurse?   Thank you!  EBM

## 2015-11-04 ENCOUNTER — Other Ambulatory Visit: Payer: Self-pay | Admitting: *Deleted

## 2015-11-04 NOTE — Telephone Encounter (Signed)
Dr Criselda Peachesmullen, the pharmacy will only do 5 patches at a time, so they only gave pt's wife 5, i checked the database and called pharm, wife also stated they needed someone to deliver medicine so she does not have to leave pt, they are changing to [facility] in [place]

## 2015-11-05 MED ORDER — FENTANYL 50 MCG/HR TD PT72: 50.0000 ug | MEDICATED_PATCH | TRANSDERMAL | Status: AC

## 2015-11-05 NOTE — Telephone Encounter (Signed)
Faxed to [facility]

## 2015-11-05 NOTE — Telephone Encounter (Signed)
No problem.  I will come down and get it to you for faxing.

## 2015-11-06 ENCOUNTER — Inpatient Hospital Stay
Admission: EM | Admit: 2015-11-06 | Discharge: 2015-11-07 | DRG: 441 | Disposition: A | Discharge status: Hospice | Attending: Internal Medicine | Admitting: Internal Medicine

## 2015-11-06 ENCOUNTER — Emergency Department

## 2015-11-06 ENCOUNTER — Encounter: Payer: Self-pay | Admitting: Emergency Medicine

## 2015-11-06 DIAGNOSIS — E876 Hypokalemia: Secondary | ICD-10-CM | POA: Diagnosis present

## 2015-11-06 DIAGNOSIS — Z515 Encounter for palliative care: Secondary | ICD-10-CM | POA: Diagnosis not present

## 2015-11-06 DIAGNOSIS — Z681 Body mass index (BMI) 19 or less, adult: Secondary | ICD-10-CM

## 2015-11-06 DIAGNOSIS — G8929 Other chronic pain: Secondary | ICD-10-CM | POA: Diagnosis present

## 2015-11-06 DIAGNOSIS — Z79891 Long term (current) use of opiate analgesic: Secondary | ICD-10-CM

## 2015-11-06 DIAGNOSIS — E43 Unspecified severe protein-calorie malnutrition: Secondary | ICD-10-CM | POA: Diagnosis present

## 2015-11-06 DIAGNOSIS — F1021 Alcohol dependence, in remission: Secondary | ICD-10-CM | POA: Diagnosis present

## 2015-11-06 DIAGNOSIS — E785 Hyperlipidemia, unspecified: Secondary | ICD-10-CM | POA: Diagnosis present

## 2015-11-06 DIAGNOSIS — R188 Other ascites: Secondary | ICD-10-CM

## 2015-11-06 DIAGNOSIS — F0390 Unspecified dementia without behavioral disturbance: Secondary | ICD-10-CM | POA: Diagnosis not present

## 2015-11-06 DIAGNOSIS — Z96641 Presence of right artificial hip joint: Secondary | ICD-10-CM | POA: Diagnosis present

## 2015-11-06 DIAGNOSIS — Z79899 Other long term (current) drug therapy: Secondary | ICD-10-CM | POA: Diagnosis not present

## 2015-11-06 DIAGNOSIS — K7210 Chronic hepatic failure without coma: Secondary | ICD-10-CM

## 2015-11-06 DIAGNOSIS — F05 Delirium due to known physiological condition: Secondary | ICD-10-CM | POA: Diagnosis not present

## 2015-11-06 DIAGNOSIS — Z820 Family history of epilepsy and other diseases of the nervous system: Secondary | ICD-10-CM | POA: Diagnosis not present

## 2015-11-06 DIAGNOSIS — R011 Cardiac murmur, unspecified: Secondary | ICD-10-CM | POA: Diagnosis present

## 2015-11-06 DIAGNOSIS — R4701 Aphasia: Secondary | ICD-10-CM | POA: Diagnosis not present

## 2015-11-06 DIAGNOSIS — D72829 Elevated white blood cell count, unspecified: Secondary | ICD-10-CM | POA: Diagnosis present

## 2015-11-06 DIAGNOSIS — K219 Gastro-esophageal reflux disease without esophagitis: Secondary | ICD-10-CM | POA: Diagnosis present

## 2015-11-06 DIAGNOSIS — L8990 Pressure ulcer of unspecified site, unspecified stage: Secondary | ICD-10-CM | POA: Diagnosis present

## 2015-11-06 DIAGNOSIS — M25559 Pain in unspecified hip: Secondary | ICD-10-CM

## 2015-11-06 DIAGNOSIS — Z66 Do not resuscitate: Secondary | ICD-10-CM | POA: Diagnosis present

## 2015-11-06 DIAGNOSIS — K7290 Hepatic failure, unspecified without coma: Principal | ICD-10-CM | POA: Diagnosis present

## 2015-11-06 DIAGNOSIS — F1020 Alcohol dependence, uncomplicated: Secondary | ICD-10-CM | POA: Diagnosis present

## 2015-11-06 DIAGNOSIS — I4891 Unspecified atrial fibrillation: Secondary | ICD-10-CM | POA: Diagnosis present

## 2015-11-06 DIAGNOSIS — I10 Essential (primary) hypertension: Secondary | ICD-10-CM | POA: Diagnosis present

## 2015-11-06 DIAGNOSIS — Z8249 Family history of ischemic heart disease and other diseases of the circulatory system: Secondary | ICD-10-CM | POA: Diagnosis not present

## 2015-11-06 DIAGNOSIS — K7031 Alcoholic cirrhosis of liver with ascites: Secondary | ICD-10-CM | POA: Diagnosis present

## 2015-11-06 DIAGNOSIS — R4182 Altered mental status, unspecified: Secondary | ICD-10-CM

## 2015-11-06 DIAGNOSIS — Z833 Family history of diabetes mellitus: Secondary | ICD-10-CM | POA: Diagnosis not present

## 2015-11-06 LAB — CBC
HCT: 28.3 % — ABNORMAL LOW (ref 40.0–52.0)
Hemoglobin: 9.6 g/dL — ABNORMAL LOW (ref 13.0–18.0)
MCH: 33.3 pg (ref 26.0–34.0)
MCHC: 34.1 g/dL (ref 32.0–36.0)
MCV: 97.7 fL (ref 80.0–100.0)
PLATELETS: 271 10*3/uL (ref 150–440)
RBC: 2.89 MIL/uL — AB (ref 4.40–5.90)
RDW: 14.9 % — ABNORMAL HIGH (ref 11.5–14.5)
WBC: 12.6 10*3/uL — AB (ref 3.8–10.6)

## 2015-11-06 LAB — COMPREHENSIVE METABOLIC PANEL
ALT: 11 U/L — AB (ref 17–63)
ANION GAP: 10 (ref 5–15)
AST: 37 U/L (ref 15–41)
Albumin: 2.2 g/dL — ABNORMAL LOW (ref 3.5–5.0)
Alkaline Phosphatase: 78 U/L (ref 38–126)
BUN: 16 mg/dL (ref 6–20)
CALCIUM: 8.2 mg/dL — AB (ref 8.9–10.3)
CHLORIDE: 92 mmol/L — AB (ref 101–111)
CO2: 31 mmol/L (ref 22–32)
CREATININE: 1.14 mg/dL (ref 0.61–1.24)
GFR, EST NON AFRICAN AMERICAN: 59 mL/min — AB (ref >60)
Glucose, Bld: 126 mg/dL — ABNORMAL HIGH (ref 65–99)
Potassium: 3 mmol/L — ABNORMAL LOW (ref 3.5–5.1)
SODIUM: 133 mmol/L — AB (ref 135–145)
Total Bilirubin: 3 mg/dL — ABNORMAL HIGH (ref 0.3–1.2)
Total Protein: 5.9 g/dL — ABNORMAL LOW (ref 6.5–8.1)

## 2015-11-06 LAB — AMMONIA
AMMONIA: 49 umol/L — AB (ref 9–35)
AMMONIA: 49 umol/L — AB (ref 9–35)

## 2015-11-06 LAB — LIPASE, BLOOD: Lipase: 21 U/L (ref 11–51)

## 2015-11-06 MED ORDER — POTASSIUM CHLORIDE 10 MEQ/100ML IV SOLN
10.0000 meq | INTRAVENOUS | Status: AC
  Administered 2015-11-07 (×3): 10 meq via INTRAVENOUS
  Filled 2015-11-06 (×3): qty 100

## 2015-11-06 MED ORDER — SPIRONOLACTONE 100 MG PO TABS
100.0000 mg | ORAL_TABLET | Freq: Every day | ORAL | Status: DC
  Administered 2015-11-07: 100 mg via ORAL
  Filled 2015-11-06: qty 1

## 2015-11-06 MED ORDER — LACTULOSE 10 GM/15ML PO SOLN
30.0000 g | Freq: Two times a day (BID) | ORAL | Status: DC
  Administered 2015-11-07: 14:00:00 15 g via ORAL
  Filled 2015-11-06 (×2): qty 60

## 2015-11-06 MED ORDER — HYDROXYZINE HCL 10 MG PO TABS: 50.0000 mg | ORAL_TABLET | Freq: Three times a day (TID) | ORAL | Status: DC | PRN

## 2015-11-06 MED ORDER — FENTANYL 50 MCG/HR TD PT72: 50.0000 ug | MEDICATED_PATCH | TRANSDERMAL | Status: DC

## 2015-11-06 MED ORDER — POTASSIUM CHLORIDE CRYS ER 20 MEQ PO TBCR
40.0000 meq | EXTENDED_RELEASE_TABLET | Freq: Once | ORAL | Status: DC
  Filled 2015-11-06: qty 2

## 2015-11-06 MED ORDER — TAMSULOSIN HCL 0.4 MG PO CAPS: 0.4000 mg | ORAL_CAPSULE | Freq: Every day | ORAL | Status: DC

## 2015-11-06 MED ORDER — SENNOSIDES-DOCUSATE SODIUM 8.6-50 MG PO TABS: 1.0000 | ORAL_TABLET | Freq: Every evening | ORAL | Status: DC | PRN

## 2015-11-06 MED ORDER — HALOPERIDOL 0.5 MG PO TABS: 1.0000 mg | ORAL_TABLET | Freq: Three times a day (TID) | ORAL | Status: DC | PRN

## 2015-11-06 MED ORDER — LORAZEPAM 0.5 MG PO TABS: 0.5000 mg | ORAL_TABLET | ORAL | Status: DC | PRN

## 2015-11-06 MED ORDER — FUROSEMIDE 40 MG PO TABS: 40.0000 mg | ORAL_TABLET | Freq: Every day | ORAL | Status: DC

## 2015-11-06 MED ORDER — HYDROCODONE-ACETAMINOPHEN 10-325 MG PO TABS: 1.0000 | ORAL_TABLET | Freq: Four times a day (QID) | ORAL | Status: DC | PRN

## 2015-11-06 NOTE — ED Notes (Signed)
Patient very weak but at baseline per family. Family concerned due patient sticking out tongue and not putting it back in his mouth.

## 2015-11-06 NOTE — ED Provider Notes (Signed)
[facility] Provider Note  ____________________________________________  Time seen: Approximately 6:26 PM  I have reviewed the triage vital signs and the nursing notes.   HISTORY  Chief Complaint Altered Mental Status  History from patient limited by acute AMS and chronic disease  HPI Mark HINDERMAN Sr. is a 82 y.o. male with end-stage liver disease requiring 6-8 week large volume paracentesis and who is on home hospice presents by EMS for evaluation of altered mental status and his tongue sticking out from his mouth.  His family reports that he seems more confused today and more ill than usual.  His symptoms are severe and nothing is helping.  Earlier today they noticed that he was not swallowing a cinnamon roll even though he frequently has issues swallowing.  When his wife pried open his mouth his tongue "fell out" and they were not able to get it back in.  He is not on any new medications and has no swelling anywhere else in his face or his neck.  He is not having any trouble breathing.  His abdomen is chronically and severely distended from ascites.  He last had a large volume paracentesis about 5 weeks ago.  He is alert to person and place but not time.  He complains of feeling bad but without giving any specifics.  Past Medical History  Diagnosis Date  . Hypertension   . Heart murmur   . Atrial fibrillation (HCC) 11/27/14  . Dementia   . Dysrhythmia   . Liver cirrhosis (HCC)   . Cirrhosis of liver (HCC)   . Ascites     Patient Active Problem List   Diagnosis Date Noted  . Acute confusional state 11/06/2015  . End stage liver disease (HCC) 09/24/2015  . Hypokalemia 08/27/2015  . Hip pain, chronic 06/23/2015  . Bilateral leg edema 05/14/2015  . Mild dementia 04/23/2015  . Foot drop, right 04/10/2015  . Dysphagia 03/23/2015  . Protein-calorie malnutrition, severe 03/11/2015  . Ascites due to alcoholic cirrhosis (HCC)     . Encephalopathy 03/10/2015  . Hepatic cirrhosis (HCC) 03/10/2015  . Poor balance 10/23/2013  . ETOHism (HCC) 08/09/2012  . Atrial fibrillation (HCC) 09/10/2010  . HTN (hypertension) 09/10/2010  . Hyperlipidemia 09/10/2010    Past Surgical History  Procedure Laterality Date  . Appendectomy    . Total hip arthroplasty  2012    right  . Joint replacement    . Esophagogastroduodenoscopy (egd) with propofol N/A 02/06/2015    Procedure: ESOPHAGOGASTRODUODENOSCOPY (EGD) WITH PROPOFOL;  Surgeon: Wallace Cullens, MD;  Location: [facility];  Service: Gastroenterology;  Laterality: N/A;    No current outpatient prescriptions on file.  Allergies Review of patient's allergies indicates no known allergies.  Family History  Problem Relation Age of Onset  . Hypertension Mother   . Diabetes Mother   . Alzheimer's disease Mother     Social History Social History  Substance Use Topics  . Smoking status: Never Smoker   . Smokeless tobacco: Never Used  . Alcohol Use: No     Comment: Quit drinking 1 year ago/ heavey drinker prior to quiting    Review of Systems Constitutional: cachetic, acutely altered Eyes: No visual changes. ENT: No sore throat.  Tongue "falling out" Cardiovascular: Denies chest pain. Respiratory: Denies shortness of breath. Gastrointestinal: +abdominal pain.  No nausea, no vomiting.  No diarrhea.  No constipation. Genitourinary: Negative for dysuria. Musculoskeletal: Negative for back pain. Skin: Negative for rash. Neurological: Negative for  headaches, focal weakness or numbness.  10-point ROS otherwise negative.  ____________________________________________   PHYSICAL EXAM:  VITAL SIGNS: ED Triage Vitals  Enc Vitals Group     BP 11/06/15 1749 127/105 mmHg     Pulse --      Resp 11/06/15 1749 20     Temp 11/06/15 1749 97.8 F (36.6 C)     Temp Source 11/06/15 1749 Axillary     SpO2 11/06/15 1749 95 %     Weight 11/06/15 1749 136 lb 7.4 oz (61.9 kg)      Height 11/06/15 1749 5\' 9"  (1.753 m)     Head Cir --      Peak Flow --      Pain Score 11/06/15 1748 5     Pain Loc --      Pain Edu? --      Excl. in GC? --     Constitutional: Somnolent, oriented to person and place. Chronic ill appearance.  Verify DO NOT RESUSCITATE/DO NOT INTUBATE with patient's spouse and paperwork Head: Atraumatic. Nose: No congestion/rhinnorhea. Mouth/Throat: Mucous membranes are dry.  Oropharynx non-erythematous. Neck: No stridor.  No meningeal signs.   Cardiovascular: Tachycardia with irregularly irregular rhythm.  Normal peripheral circulation. Grossly normal heart sounds.   Respiratory: Normal respiratory effort.  No retractions. Lungs CTAB. Gastrointestinal: Severe distention with tense ascites.  Tender to palpation throughout. Musculoskeletal: No lower extremity tenderness nor edema. No gross deformities of extremities.  No asterixis. Neurologic:  The patient is currently biting down on a mouth swab.  With some encouragement and assistance he is able to open his mouth and when that happens his tongue appears to be fasciculating and quickly falls out between his teeth.  I encouraged him to put his tongue back in his mouth and again with some assistance he was able to do so while this time continues to fasciculating.  He then bit backed down on the tongue swab.  Moving his extremities and there is no obvious facial droop, cranial nerve deficit, or other focal neurological deficit. Skin:  Skin is warm, dry and intact. No rash noted.    ____________________________________________   LABS (all labs ordered are listed, but only abnormal results are displayed)  Labs Reviewed  COMPREHENSIVE METABOLIC PANEL - Abnormal; Notable for the following:    Sodium 133 (*)    Potassium 3.0 (*)    Chloride 92 (*)    Glucose, Bld 126 (*)    Calcium 8.2 (*)    Total Protein 5.9 (*)    Albumin 2.2 (*)    ALT 11 (*)    Total Bilirubin 3.0 (*)    GFR calc non Af Amer 59  (*)    All other components within normal limits  CBC - Abnormal; Notable for the following:    WBC 12.6 (*)    RBC 2.89 (*)    Hemoglobin 9.6 (*)    HCT 28.3 (*)    RDW 14.9 (*)    All other components within normal limits  AMMONIA - Abnormal; Notable for the following:    Ammonia 49 (*)    All other components within normal limits  AMMONIA - Abnormal; Notable for the following:    Ammonia 49 (*)    All other components within normal limits  LIPASE, BLOOD  BASIC METABOLIC PANEL  CBC  PROTIME-INR  CBG MONITORING, ED   ____________________________________________  EKG  ED ECG REPORT I, Medina Degraffenreid, the attending physician, personally viewed and interpreted this ECG.  Date: 11/06/2015  EKG Time: 17:47  Rate: 130  Rhythm: atrial fibrillation, rate 130  Axis: Normal  Intervals:Low amplitude throughout, atrial fibrillation, QTC is normal  ST&T Change: Non-specific ST segment / T-wave changes, but no evidence of acute ischemia.   ____________________________________________  RADIOLOGY   Ct Head Wo Contrast  11/06/2015  CLINICAL DATA:  Possible stroke, altered for approximately 3 hours. Patient under hospice care. EXAM: CT HEAD WITHOUT CONTRAST TECHNIQUE: Contiguous axial images were obtained from the base of the skull through the vertex without intravenous contrast. COMPARISON:  Head CTs dated 03/10/2015 and 02/03/2015. Also brain MRI dated 08/09/2014. FINDINGS: Brain: Again noted is generalized age-related brain atrophy with commensurate dilatation of the ventricles and sulci. Mild chronic small vessel ischemic change again noted within the bilateral periventricular white matter regions. All other areas of the brain demonstrate normal gray-white matter attenuation. There is no mass, hemorrhage, edema or other evidence of acute parenchymal abnormality identified. No extra-axial hemorrhage. Vascular: No hyperdense vessel or unexpected calcification. There are chronic calcified  atherosclerotic changes of the large vessels at the skull base. Skull: Negative for fracture or focal lesion. Sinuses/Orbits: No acute findings. Visualized upper paranasal sinuses are clear. Other: None. IMPRESSION: 1. No acute findings.  No intracranial mass, hemorrhage or edema. 2. Age-related brain atrophy and chronic ischemic changes in the white matter, similar to previous exams. Electronically Signed   By: Bary RichardStan  Maynard M.D.   On: 11/06/2015 19:12    ____________________________________________   PROCEDURES  Procedure(s) performed: None  Critical Care performed: No    ____________________________________________   INITIAL IMPRESSION / ASSESSMENT AND PLAN / ED COURSE  Pertinent labs & imaging results that were available during my care of the patient were reviewed by me and considered in my medical decision making (see chart for details).  Differential in this chronically ill patient is very broad but the most likely candidates for his acute change are SBP or urinary tract infection.  Pneumonia is also possible but he has had no shortness of breath or cough recently.  I obtained a stat head CT which shows no acute changes.  The patient is tachycardic in the 120s to 130s.  His wife confirms that he does have a history of atrial fibrillation and did confirm what I read in the electronic note from his primary care doctor, that they had taken him off of all of his medications except for diuretics and lactulose.  He is already on outpatient Haldol for agitation.  Given his current status and chronic illness, I had a lengthy discussion with his wife who is the power of attorney.  I explained to her that we can do in and out catheter to check his urine and I can perform a diagnostic paracentesis and we can treat him for sepsis with IV antibiotics and fluids and bring him into the [facility].  However I am not certain that that is what is best for him and not certain that this is in concordance  with his wishes.  She understands and she will discuss with her family and give me a final answer about whether they wish to admit for end-of-life/Comfort Care or whether they want me to proceed with a sepsis workup.  (Note that documentation was delayed due to multiple ED patients requiring immediate care.)  I had multiple extensive conversations with the patient's family.  His wife who is the power of attorney agrees that she does not want any additional treatment at this time including no antibiotics,  no invasive evaluations, etc.  I discussed the case with the hospitalist who will admit him for further end-of-life care, palliative care consult, Comfort Care, etc.  She agreed with my assessment.  We did look into whether it would be possible to place the patient directly into hospice home, but they have no reds available at this time.  ____________________________________________  FINAL CLINICAL IMPRESSION(S) / ED DIAGNOSES  Final diagnoses:  End stage liver disease (HCC)  Altered mental status, unspecified altered mental status type  Atrial fibrillation with rapid ventricular response (HCC)  Ascites  Leukocytosis      NEW OUTPATIENT MEDICATIONS STARTED DURING THIS VISIT:  Current Discharge Medication List        Note:  This document was prepared using Dragon voice recognition software and may include unintentional dictation errors.   Loleta Rose, MD 11/06/15 2352

## 2015-11-06 NOTE — H&P (Addendum)
PCP:   Debe CoderMULLEN, EMILY, MD   Chief Complaint:  Confusion  HPI: This is a 82 year old gentleman with known history of end-stage liver disease followed by hospice. Today he became acutely confused, develop twitches and became somewhat tremulous. There is no fever, nausea or vomiting. The wife was concerned and called hospice and he was transported to the ER. In the ER the patient's ammonia level is 49, his white blood count is 12.6. His abdomen is somewhat tender. The patient is DO NOT RESUSCITATE/DO NOT INTUBATE. The ER physician and family had extensive discussion and the decision was made to make the patient comfort care with hospice. I called hospice [phone] and they're agreeable but no beds are available, the hospitalist have been asked to admit.  The patient has been chronically ill for the last 2 years, mostly completely dependent care. He lives at home with his wife who is his primary care taker, she is present at bedside. He gets a paracentesis monthly. [facility] because his home visits.   Review of Systems:  The patient denies anorexia, fever, weight loss, confusion, vision loss, decreased hearing, hoarseness, chest pain, syncope, dyspnea on exertion, peripheral edema, balance deficits, hemoptysis, abdominal pain, melena, hematochezia, severe indigestion/heartburn, hematuria, incontinence, genital sores, muscle weakness, suspicious skin lesions, transient blindness, difficulty walking, depression, unusual weight change, abnormal bleeding, enlarged lymph nodes, angioedema, and breast masses.  Past Medical History: Past Medical History  Diagnosis Date  . Hypertension   . Heart murmur   . Atrial fibrillation (HCC) 11/27/14  . Dementia   . Dysrhythmia   . Liver cirrhosis (HCC)   . Cirrhosis of liver (HCC)   . Ascites    Past Surgical History  Procedure Laterality Date  . Appendectomy    . Total hip arthroplasty  2012    right  . Joint replacement    .  Esophagogastroduodenoscopy (egd) with propofol N/A 02/06/2015    Procedure: ESOPHAGOGASTRODUODENOSCOPY (EGD) WITH PROPOFOL;  Surgeon: Wallace CullensPaul Y Oh, MD;  [facility];  Service: Gastroenterology;  Laterality: N/A;    Medications: Prior to Admission medications   Medication Sig Start Date End Date Taking? Authorizing Provider  fentaNYL (DURAGESIC - DOSED MCG/HR) 50 MCG/HR Place 1 patch (50 mcg total) onto the skin every 3 (three) days. 11/05/15   Inez CatalinaEmily B Mullen, MD  furosemide (LASIX) 20 MG tablet Take 40 mg by mouth daily.    Historical Provider, MD  haloperidol (HALDOL) 1 MG tablet Take 1 tablet (1 mg total) by mouth every 8 (eight) hours as needed for agitation. 10/21/15 10/20/16  Inez CatalinaEmily B Mullen, MD  HYDROcodone-acetaminophen (NORCO) 10-325 MG tablet Take 1 tablet by mouth every 6 (six) hours as needed. HOSPICE PATIENT 10/21/15   Inez CatalinaEmily B Mullen, MD  hydrOXYzine (ATARAX/VISTARIL) 50 MG tablet Take 1 tablet (50 mg total) by mouth 3 (three) times daily as needed for anxiety or nausea. 09/24/15   Inez CatalinaEmily B Mullen, MD  lactulose (CHRONULAC) 10 GM/15ML solution Take 45 mLs (30 g total) by mouth 2 (two) times daily. 03/21/15   Carly Arlyce HarmanJ Rivet, MD  LORazepam (ATIVAN) 0.5 MG tablet Take 1-2 tablets (0.5-1 mg total) by mouth every 4 (four) hours as needed for anxiety or sleep. 09/26/15 09/25/16  Inez CatalinaEmily B Mullen, MD  spironolactone (ALDACTONE) 100 MG tablet Take 1 tablet (100 mg total) by mouth daily. 10/07/15 10/06/16  Beather Arbourushil V Patel, MD  tamsulosin (FLOMAX) 0.4 MG CAPS capsule TAKE 1 CAPSULE BY MOUTH ONCE A DAY 05/28/15   Dillard CannonEmily B  Criselda Peaches, MD    Allergies:  No Known Allergies  Social History:  reports that he has never smoked. He has never used smokeless tobacco. He reports that he does not drink alcohol or use illicit drugs.  Family History: Family History  Problem Relation Age of Onset  . Hypertension Mother   . Diabetes Mother   . Alzheimer's disease Mother     Physical Exam: Filed Vitals:    11/06/15 1749 11/06/15 1800 11/06/15 1815 11/06/15 1830  BP: 127/105 119/74  119/86  Temp: 97.8 F (36.6 C)     TempSrc: Axillary     Resp: Height:  (1.753 m)     Weight: 61.9 kg (136 lb 7.4 oz)     SpO2: 95%       General:  Alert and Sluggish but oriented, cachectic, no acute distress Eyes: PERRLA, pink conjunctiva, no scleral icterus ENT: Moist oral mucosa, neck supple, no thyromegaly Lungs: clear to ascultation, no wheeze, no crackles, no use of accessory muscles Cardiovascular: regular rate and rhythm, no regurgitation, no gallops, no murmurs. No carotid bruits, no JVD Abdomen: soft, positive BS, mild tenderness to palpation greater in the right, positive ascites, no organomegaly, not an acute abdomen GU: not examined Neuro: CN II - XII grossly intact, sensation intact Musculoskeletal: strength unable to properly assess due to patient's weakness, no clubbing, cyanosis or edema Skin: no rash, no subcutaneous crepitation, no decubitus Psych: appropriate patient   Labs on Admission:   Recent Labs  11/06/15 1754  NA 133*  K 3.0*  CL 92*  CO2 31  GLUCOSE 126*  BUN 16  CREATININE 1.14  CALCIUM 8.2*    Recent Labs  11/06/15 1754  AST 37  ALT 11*  ALKPHOS 78  BILITOT 3.0*  PROT 5.9*  ALBUMIN 2.2*    Recent Labs  11/06/15 1753  LIPASE 21    Recent Labs  11/06/15 1754  WBC 12.6*  HGB 9.6*  HCT 28.3*  MCV 97.7  PLT 271   No results for input(s): CKTOTAL, CKMB, CKMBINDEX, TROPONINI in the last 72 hours. Invalid input(s): POCBNP No results for input(s): DDIMER in the last 72 hours. No results for input(s): HGBA1C in the last 72 hours. No results for input(s): CHOL, HDL, LDLCALC, TRIG, CHOLHDL, LDLDIRECT in the last 72 hours. No results for input(s): TSH, T4TOTAL, T3FREE, THYROIDAB in the last 72 hours.  Invalid input(s): FREET3 No results for input(s): VITAMINB12, FOLATE, FERRITIN, TIBC, IRON, RETICCTPCT in the last 72 hours.  Micro  Results: No results found for this or any previous visit (from the past 240 hour(s)).   Radiological Exams on Admission: Ct Head Wo Contrast  11/06/2015  CLINICAL DATA:  Possible stroke, altered for approximately 3 hours. Patient under hospice care. EXAM: CT HEAD WITHOUT CONTRAST TECHNIQUE: Contiguous axial images were obtained from the base of the skull through the vertex without intravenous contrast. COMPARISON:  Head CTs dated 03/10/2015 and 02/03/2015. Also brain MRI dated 08/09/2014. FINDINGS: Brain: Again noted is generalized age-related brain atrophy with commensurate dilatation of the ventricles and sulci. Mild chronic small vessel ischemic change again noted within the bilateral periventricular white matter regions. All other areas of the brain demonstrate normal gray-white matter attenuation. There is no mass, hemorrhage, edema or other evidence of acute parenchymal abnormality identified. No extra-axial hemorrhage. Vascular: No hyperdense vessel or unexpected calcification. There are chronic calcified atherosclerotic changes of the large vessels at the skull base. Skull: Negative for fracture or  focal lesion. Sinuses/Orbits: No acute findings. Visualized upper paranasal sinuses are clear. Other: None. IMPRESSION: 1. No acute findings.  No intracranial mass, hemorrhage or edema. 2. Age-related brain atrophy and chronic ischemic changes in the white matter, similar to previous exams. Electronically Signed   By: Bary Richard M.D.   On: 11/06/2015 19:12    Assessment/Plan Present on Admission:  . Acute confusional state -Admit to MedSurg -Confusion and takes likely due to infection. We'll order a UA and consult hospice. The patient appears to be in agreement. The family requests comfort measures. Will consult social worker for hopeful placement in a.m.  Hypokalemia -Replete by mouth, BMP in a.m.   . Ascites due to alcoholic cirrhosis (HCC) -Aware, monitor  . ETOHism [facility]) -Patient has  not drunk in over a year  . Hyperlipidemia -Stable  . Protein-calorie malnutrition, severe  -Stable, will order Ensure   Khyre Germond 11/06/2015, 9:09 PM

## 2015-11-06 NOTE — ED Notes (Signed)
Pt arrived via EMS from home for reports of possible stroke per family. Pt family states tongue sticking out more altered for approximately three hours. Pt is under hospice care. EMS reports 106/64, 90-140 HR irregular, 97% RA, CBG 127.

## 2015-11-07 DIAGNOSIS — Z515 Encounter for palliative care: Secondary | ICD-10-CM

## 2015-11-07 DIAGNOSIS — I10 Essential (primary) hypertension: Secondary | ICD-10-CM

## 2015-11-07 DIAGNOSIS — N400 Enlarged prostate without lower urinary tract symptoms: Secondary | ICD-10-CM

## 2015-11-07 DIAGNOSIS — F05 Delirium due to known physiological condition: Secondary | ICD-10-CM

## 2015-11-07 DIAGNOSIS — K7460 Unspecified cirrhosis of liver: Secondary | ICD-10-CM

## 2015-11-07 DIAGNOSIS — K219 Gastro-esophageal reflux disease without esophagitis: Secondary | ICD-10-CM

## 2015-11-07 DIAGNOSIS — Z79899 Other long term (current) drug therapy: Secondary | ICD-10-CM

## 2015-11-07 DIAGNOSIS — F0390 Unspecified dementia without behavioral disturbance: Secondary | ICD-10-CM

## 2015-11-07 DIAGNOSIS — L8990 Pressure ulcer of unspecified site, unspecified stage: Secondary | ICD-10-CM | POA: Insufficient documentation

## 2015-11-07 DIAGNOSIS — R4701 Aphasia: Secondary | ICD-10-CM

## 2015-11-07 LAB — PROTIME-INR
INR: 1.67
Prothrombin Time: 19.7 seconds — ABNORMAL HIGH (ref 11.4–15.0)

## 2015-11-07 LAB — BASIC METABOLIC PANEL
Anion gap: 10 (ref 5–15)
BUN: 18 mg/dL (ref 6–20)
CO2: 33 mmol/L — ABNORMAL HIGH (ref 22–32)
CREATININE: 1.19 mg/dL (ref 0.61–1.24)
Calcium: 8.5 mg/dL — ABNORMAL LOW (ref 8.9–10.3)
Chloride: 93 mmol/L — ABNORMAL LOW (ref 101–111)
GFR, EST NON AFRICAN AMERICAN: 56 mL/min — AB (ref >60)
Glucose, Bld: 116 mg/dL — ABNORMAL HIGH (ref 65–99)
POTASSIUM: 4.2 mmol/L (ref 3.5–5.1)
SODIUM: 136 mmol/L (ref 135–145)

## 2015-11-07 LAB — CBC
HEMATOCRIT: 26.9 % — AB (ref 40.0–52.0)
Hemoglobin: 9.3 g/dL — ABNORMAL LOW (ref 13.0–18.0)
MCH: 33.7 pg (ref 26.0–34.0)
MCHC: 34.7 g/dL (ref 32.0–36.0)
MCV: 97.1 fL (ref 80.0–100.0)
PLATELETS: 259 10*3/uL (ref 150–440)
RBC: 2.77 MIL/uL — AB (ref 4.40–5.90)
RDW: 15.2 % — AB (ref 11.5–14.5)
WBC: 13.4 10*3/uL — AB (ref 3.8–10.6)

## 2015-11-07 MED ORDER — MORPHINE SULFATE (CONCENTRATE) 10 MG/0.5ML PO SOLN: 5.0000 mg | ORAL | Status: AC | PRN

## 2015-11-07 MED ORDER — MORPHINE SULFATE (PF) 2 MG/ML IV SOLN
1.0000 mg | Freq: Once | INTRAVENOUS | Status: AC
  Administered 2015-11-07: 02:00:00 1 mg via INTRAVENOUS
  Filled 2015-11-07: qty 1

## 2015-11-07 MED ORDER — PROCHLORPERAZINE 25 MG RE SUPP: 25.0000 mg | Freq: Three times a day (TID) | RECTAL | Status: AC | PRN

## 2015-11-07 MED ORDER — MORPHINE SULFATE (PF) 2 MG/ML IV SOLN
1.0000 mg | Freq: Once | INTRAVENOUS | Status: AC
  Administered 2015-11-07: 03:00:00 1 mg via INTRAVENOUS
  Filled 2015-11-07: qty 1

## 2015-11-07 MED ORDER — LORAZEPAM 2 MG/ML PO CONC
1.0000 mg | ORAL | Status: DC | PRN
  Filled 2015-11-07: qty 1

## 2015-11-07 MED ORDER — MORPHINE SULFATE (PF) 2 MG/ML IV SOLN
INTRAVENOUS | Status: AC
  Filled 2015-11-07: qty 1

## 2015-11-07 MED ORDER — LORAZEPAM 2 MG/ML PO CONC
0.5000 mg | Freq: Two times a day (BID) | ORAL | Status: DC
  Administered 2015-11-07: 14:00:00 0.5 mg via ORAL

## 2015-11-07 MED ORDER — PROCHLORPERAZINE 25 MG RE SUPP: 25.0000 mg | Freq: Three times a day (TID) | RECTAL | Status: DC | PRN

## 2015-11-07 MED ORDER — ENSURE ENLIVE PO LIQD: 237.0000 mL | Freq: Two times a day (BID) | ORAL | Status: DC

## 2015-11-07 MED ORDER — BISACODYL 10 MG RE SUPP: 10.0000 mg | Freq: Every day | RECTAL | Status: AC | PRN

## 2015-11-07 MED ORDER — PANTOPRAZOLE SODIUM 40 MG PO PACK
40.0000 mg | PACK | Freq: Once | ORAL | Status: AC
  Administered 2015-11-07: 14:00:00 40 mg via ORAL
  Filled 2015-11-07: qty 20

## 2015-11-07 MED ORDER — BISACODYL 10 MG RE SUPP: 10.0000 mg | Freq: Every day | RECTAL | Status: DC | PRN

## 2015-11-07 MED ORDER — MORPHINE SULFATE (CONCENTRATE) 10 MG/0.5ML PO SOLN: 5.0000 mg | ORAL | Status: DC | PRN

## 2015-11-07 MED ORDER — MORPHINE SULFATE (PF) 2 MG/ML IV SOLN
2.0000 mg | INTRAVENOUS | Status: DC | PRN
  Administered 2015-11-07 (×2): 2 mg via INTRAVENOUS
  Filled 2015-11-07: qty 1

## 2015-11-07 NOTE — Progress Notes (Signed)
Visit made. Patient is currently followed by [facility] at home with a hospice diagnosis of liver cirrhosis. He is a DNR code. [facility]8 for evaluation of altered mental status with questionable CVA. Head CT was negative for any acute process. Family was concerned that patient would not open his mouth. He has been seen by both Palliative medicine physician and Speech therapy, patient does at times open his mouth. Education has been provided to family regarding disease process, dementia and supportive care. Writer spoke on the phone with patient's wife Gillian Shieldsamela King who is in agreement with patient returning home today. He will require EMS transport. Hospital care team made aware. Patient seen lying in bed, alert, appeared to be resting off and on, denied pain. Writer also spoke with patient's sister in law SherrelwoodVicky and niece Darl PikesSusan in the room, all are in agreement with patient returning home. Hospice team updated to discharge. Thank you. Dayna BarkerKaren Robertson [facility], [facility] [phone] c

## 2015-11-07 NOTE — Consult Note (Signed)
WOC wound consult note Reason for Consult:Pressure injury with protein-calorie malnutrition, is Hospice care for end stage liver disease Wound type:deep tissue pressure injury to right trochanter Stage 2 pressure injury to coccyx Pressure Ulcer POA: Yes Measurement: Right trochanter  5 cm x 6 cm adherent slough and maroon discoloration in the center.  Deep tissue injury present Coccyx 2 cm x 2 cm x 0.1 cm pink and moist denuded lesion  Drainage (amount, consistency, odor) Minimal serosanguinous  No odor Periwound:Intact Dressing procedure/placement/frequency:Cleanse wounds to right hip and coccyx with NS and pat gently dry.  Apply silicone border foam dressing.  Change every 3 days and PRN soilage.  No plastic underpads or disposable briefs. Will not follow at this time.  Please re-consult if needed.  Maple HudsonKaren Aycen Porreca RN BSN CWON Pager [phone]

## 2015-11-07 NOTE — Care Management Important Message (Signed)
Important Message  Patient Details  Name: Mark HarmanRobert L Iglesias Sr. MRN: [id number] Date of Birth: 1933/09/28   Medicare Important Message Given:  Yes    Gwenette GreetBrenda S Gaspar, RN 11/07/2015, 8:30 AM

## 2015-11-07 NOTE — Consult Note (Addendum)
Palliative Medicine Inpatient Consult Note   Name: Mark HarmanRobert L Varricchio Sr. Date: 11/07/2015 MRN: [id number]  DOB: 1933/10/15  Referring Physician: Enid Baasadhika Kalisetti, MD  Palliative Care consult requested for this 82 y.o. male for goals of medical therapy in patient with inability to properly open his mouth.  He had his tongue stuck out of his mouoth and it wouldn't go back in and he was sent to the ED for that problem. Pt is followed by [facility] in the home setting where his wife is primary caregiver.     DISCUSSIONS AND PLAN: have seen pt and talked with his sister-in-law, Mark Stephens, who was bedside. Pt is now opening his mouth some.  But, he is NOT a good candidate to continue taking his 9-15 pills per day that are in the current orders. I was asked by nursing to address the oral meds to see if we can minimize these and also limit meds to either liquids or crushables. I mentioned to the sister-in-law that I would be changing his meds.  I have also spoken with Hospice Liaison about pt since he is followed by Hospice in his home. I mentioned to the sister-in-law that there are no Hospice Home beds and may not be any available for some time. She seemed to think it would be fine for pt to return home --with extra family pitching in to help pts wife. But, of course that has to be worked out with pt's wife -- I let Hospice liaison know.  I would avoid Haldol and Seroquel -- if possible, as his presentation could be related to a dyskinesia or dystonia that might possibly be related to / worsened by antipsychotic use. I have changed some meds to solutions and have DCd the Flomax capsule. He may require a foley w/o the Flomax.   Pt is not necessarily imminently terminal. It may depend on his oral intake of food. He is opening his mouth now. He may return home back under Hospice care soon (there are no Hospice Home beds available and it could be days before there are beds).  Will work with Hospice Liaison and appreciate her involvement.  Hospice Liaison will continue with supportive discussions with pts wife and will talk with her about disposition. As there are no Hospice Home beds, and since this problem has resolved somewhat, he might end up going back home where Hospice can continue to follow him. That is for Hospice to work out with the wife and the attending MD.    NOTE: I have taken the liberty of completing the DC med rec after talking with Hospice Liaison about each medication to discharge him with.  I will sign off at this time.  Thank you for the consult.   ---------------------------------------------------------------------- Problems: HTN CIrrhosis With ascites Heart murmur BPH GERD    REVIEW OF SYSTEMS:  Patient is not able to provide ROS due to being nonverbal due to advanced dementia.   SPIRITUAL SUPPORT SYSTEM: Yes.  SOCIAL HISTORY:  reports that he has never smoked. He has never used smokeless tobacco. He reports that he does not drink alcohol or use illicit drugs.  LEGAL DOCUMENTS DNR form is in the chart  CODE STATUS: DNR  PAST MEDICAL HISTORY: Past Medical History  Diagnosis Date  . Hypertension   . Heart murmur   . Atrial fibrillation (HCC) 11/27/14  . Dementia   . Dysrhythmia   . Liver cirrhosis (HCC)   . Cirrhosis of liver (HCC)   .  Ascites     PAST SURGICAL HISTORY:  Past Surgical History  Procedure Laterality Date  . Appendectomy    . Total hip arthroplasty  2012    right  . Joint replacement    . Esophagogastroduodenoscopy (egd) with propofol N/A 02/06/2015    Procedure: ESOPHAGOGASTRODUODENOSCOPY (EGD) WITH PROPOFOL;  Surgeon: Wallace Cullens, MD;  Location[facility] ENDOSCOPY;  Service: Gastroenterology;  Laterality: N/A;    ALLERGIES:  has No Known Allergies.  MEDICATIONS:  Current Facility-Administered Medications  Medication Dose Route Frequency Provider Last Rate Last Dose  . bisacodyl (DULCOLAX) suppository  10 mg  10 mg Rectal Daily PRN Suan Halter, MD      . Melene Muller ON 11/08/2015] fentaNYL (DURAGESIC - dosed mcg/hr) 50 mcg  50 mcg Transdermal Q72H Debby Crosley, MD      . lactulose (CHRONULAC) 10 GM/15ML solution 30 g  30 g Oral BID Debby Crosley, MD   30 g at 11/07/15 0034  . LORazepam (ATIVAN) 2 MG/ML concentrated solution 0.5 mg  0.5 mg Oral BID Suan Halter, MD      . LORazepam (ATIVAN) 2 MG/ML concentrated solution 1 mg  1 mg Oral Q4H PRN Suan Halter, MD      . morphine 2 MG/ML injection 2 mg  2 mg Intravenous Q2H PRN Suan Halter, MD   2 mg at 11/07/15 1037  . morphine 2 MG/ML injection           . morphine CONCENTRATE 10 MG/0.5ML oral solution 5 mg  5 mg Sublingual Q1H PRN Suan Halter, MD      . pantoprazole sodium (PROTONIX) 40 mg/20 mL oral suspension 40 mg  40 mg Oral Once Suan Halter, MD      . prochlorperazine (COMPAZINE) suppository 25 mg  25 mg Rectal Q8H PRN Suan Halter, MD      . spironolactone (ALDACTONE) tablet 100 mg  100 mg Oral Daily Debby Crosley, MD   100 mg at 11/06/15 2319    Vital Signs: BP 108/66 mmHg  Pulse 108  Temp(Src) 97.5 F (36.4 C) (Axillary)  Resp 20  Ht 5\' 9"  (1.753 m)  Wt 58.514 kg (129 lb)  BMI 19.04 kg/m2  SpO2 96% Filed Weights   11/06/15 1749 11/06/15 2224  Weight: 61.9 kg (136 lb 7.4 oz) 58.514 kg (129 lb)    Estimated body mass index is 19.04 kg/(m^2) as calculated from the following:   Height as of this encounter: 5\' 9"  (1.753 m).   Weight as of this encounter: 58.514 kg (129 lb).  PERFORMANCE STATUS (ECOG) : 4 - Bedbound  PHYSICAL EXAM: Pt is sitting up (with head of bed up), alert and nonverbal  --he is seen to be spontaneously opening his mouth now and then --no tongue sticking out EOMI OP clear Neck w/o JVD or TM Hrt rrr no m Lungs cta Abd soft and NT Ext no mottling or cyanosis Skin warm and dry Muscle atrophy is noted He does not follow commands   LABS: CBC:     Component Value Date/Time   WBC 13.4* 11/07/2015 0441   WBC 11.3* 06/18/2015 1101   HGB 9.3* 11/07/2015 0441   HCT 26.9* 11/07/2015 0441   HCT 35.6* 06/18/2015 1101   PLT 259 11/07/2015 0441   PLT 347 06/18/2015 1101   MCV 97.1 11/07/2015 0441   MCV 97 06/18/2015 1101   NEUTROABS 5.9 08/06/2015 0921   LYMPHSABS 1.8 08/06/2015 0921   MONOABS 1.2* 08/06/2015 4098  EOSABS 0.1 08/06/2015 0921   BASOSABS 0.1 08/06/2015 0921   Comprehensive Metabolic Panel:    Component Value Date/Time   NA 136 11/07/2015 0441   NA 135 06/18/2015 1101   K 4.2 11/07/2015 0441   CL 93* 11/07/2015 0441   CO2 33* 11/07/2015 0441   BUN 18 11/07/2015 0441   BUN 8 06/18/2015 1101   CREATININE 1.19 11/07/2015 0441   GLUCOSE 116* 11/07/2015 0441   GLUCOSE 115* 06/18/2015 1101   CALCIUM 8.5* 11/07/2015 0441   AST 37 11/06/2015 1754   ALT 11* 11/06/2015 1754   ALKPHOS 78 11/06/2015 1754   BILITOT 3.0* 11/06/2015 1754   BILITOT 2.1* 06/18/2015 1101   PROT 5.9* 11/06/2015 1754   PROT 6.2 06/18/2015 1101   ALBUMIN 2.2* 11/06/2015 1754   ALBUMIN 3.0* 06/18/2015 1101    More than 50% of the visit was spent in counseling/coordination of care: Yes  Time Spent: 80 minutes

## 2015-11-07 NOTE — Plan of Care (Signed)
Problem: SLP Dysphagia Goals Goal: Misc Dysphagia Goal Pt will safely tolerate po diet of least restrictive consistency w/ no overt s/s of aspiration noted by Staff/pt/family x3 sessions.    

## 2015-11-07 NOTE — Evaluation (Signed)
Clinical/Bedside Swallow Evaluation Patient Details  Name: Mark HarmanRobert L Lakey Stephens. MRN: [id number] Date of Birth: 1934/05/10  Today's Date: 11/07/2015 Time: SLP Start Time (ACUTE ONLY): 1000 SLP Stop Time (ACUTE ONLY): 1100 SLP Time Calculation (min) (ACUTE ONLY): 60 min  Past Medical History:  Past Medical History  Diagnosis Date  . Hypertension   . Heart murmur   . Atrial fibrillation (HCC) 11/27/14  . Dementia   . Dysrhythmia   . Liver cirrhosis (HCC)   . Cirrhosis of liver (HCC)   . Ascites    Past Surgical History:  Past Surgical History  Procedure Laterality Date  . Appendectomy    . Total hip arthroplasty  2012    right  . Joint replacement    . Esophagogastroduodenoscopy (egd) with propofol N/A 02/06/2015    Procedure: ESOPHAGOGASTRODUODENOSCOPY (EGD) WITH PROPOFOL;  Surgeon: Wallace CullensPaul Y Oh, MD;  [facility];  Service: Gastroenterology;  Laterality: N/A;   HPI:  Pt is a 82 year old gentleman with known history of liver cirrhosis, ETOH abuse, end-stage liver disease, Dementia followed by hospice. Today he became acutely confused, develop twitches and became somewhat tremulous. There is no fever, nausea or vomiting. The wife was concerned and called hospice and he was transported to the ER. In the ER the patient's ammonia level is 49, his white blood count is 12.6. His abdomen is tender. The patient has been chronically ill for the last 2 years, mostly completely dependent care. He lives at home with his wife who is his primary care taker. He gets a paracentesis monthly. [facility] care via home visits. At admission and at home, pt's tongue was sticking out from his mouth.His family reports that he seemed more confused and more ill than usual.At home, he was not swallowing; frequently has issues swallowing.At admission, pt exhibited lingual protrusion and was unable to hold his tongue in his mouth; no swelling noted in his face or his neck. He is not having any  trouble breathing.Currently, pt does verbally respond w/ the swab clenched in his mouth. He attempted to release the swab and even stated he would open his mouth but could not. His teeth and jaw remained clenched shut despite cues, stim, and redirection from SLP and NSG staff. He eventually released the swab w/ encouragement.    Assessment / Plan / Recommendation Clinical Impression  Pt appears to present w/ severe oral phase deficits impacted by severely declined Cognitive deficits/abilities resulting in Apraxic-like behaviors and responses w/ po trials/intake. Pt maintained a clenched jaw presentation intermittently opening his mouth to the presentation/stim of po's via utensil/straw, groping lingual movements at times and a tongue upward position was frequently noted. The Apraxic-like behavior prevented pt from being able to take boluses into his mouth effectively. Howveve, when bolus material (via pinched straw, or placed over the tongue) could get into the mid oral cavity area, pt responded w/ oral control for A-P transfer followed by an appopriate pharyngeal swallow. N overt s/s of aspiration were noted w/ the few trials of each consistency that could be accepted orally. Pt is at increased risk for aspiration sec. to the declined Cognitive status impairing the oral phase of swallowing which can then impact the pharyngeal phase. Recommend a pureed/full liquid diet consistency w/ thin liquids at this time; strict aspiration precautions; strict feeding support and monitoring for oral clearing/swallowing. Do NOT feed pt if he is unable to follow through w/ accepting and swallowing/clearing po intake. Recommend meds crushed in puree - check w/ pharmacy.  Aspiration Risk  Moderate aspiration risk    Diet Recommendation  Dysphagia 1(puree) w/ thin liquids; strict aspiration precautions. Monitoring w/ any oral intake for oral clearing. Support and encouragement.   Medication Administration: Crushed  with puree    Other  Recommendations Recommended Consults:  (dietician) Oral Care Recommendations: Oral care BID;Staff/trained caregiver to provide oral care   Follow up Recommendations  Home health SLP;Skilled Nursing facility (TBD)    Frequency and Duration min 3x week  2 weeks       Prognosis Prognosis for Safe Diet Advancement: Guarded Barriers to Reach Goals: Cognitive deficits;Severity of deficits;Behavior      Swallow Study   General Date of Onset: 11/06/15 HPI: Pt is a 82 year old gentleman with known history of liver cirrhosis, ETOH abuse, end-stage liver disease, Dementia followed by hospice. Today he became acutely confused, develop twitches and became somewhat tremulous. There is no fever, nausea or vomiting. The wife was concerned and called hospice and he was transported to the ER. In the ER the patient's ammonia level is 49, his white blood count is 12.6. His abdomen is tender. The patient has been chronically ill for the last 2 years, mostly completely dependent care. He lives at home with his wife who is his primary care taker. He gets a paracentesis monthly. [facility] care via home visits. At admission and at home, pt's tongue was sticking out from his mouth.His family reports that he seemed more confused and more ill than usual.At home, he was not swallowing; frequently has issues swallowing.At admission, pt exhibited lingual protrusion and was unable to hold his tongue in his mouth; no swelling noted in his face or his neck. He is not having any trouble breathing.Currently, pt does verbally respond w/ the swab clenched in his mouth. He attempted to release the swab and even stated he would open his mouth but could not. His teeth and jaw remained clenched shut despite cues, stim, and redirection from SLP and NSG staff. He eventually released the swab w/ encouragement.  Type of Study: Bedside Swallow Evaluation Previous Swallow Assessment: none Diet Prior to this  Study:  (regular foods per wife) Temperature Spikes Noted: No (wbc 13.4) Respiratory Status: Room air History of Recent Intubation: No Behavior/Cognition: Cooperative;Confused;Distractible;Doesn't follow directions;Requires cueing (awake) Oral Cavity Assessment:  (could not assess d/t clenched jaw/teeth) Oral Care Completed by SLP:  (unable to perform) Oral Cavity - Dentition: Adequate natural dentition (in front) Vision:  (n/a) Self-Feeding Abilities: Total assist Patient Positioning: Upright in bed Baseline Vocal Quality: Normal (intermittent verbal responses; y/n) Volitional Cough: Cognitively unable to elicit Volitional Swallow: Unable to elicit    Oral/Motor/Sensory Function Overall Oral Motor/Sensory Function:  (unable to assess sec. to clenched jaw/teeth)   Ice Chips Ice chips: Not tested   Thin Liquid Thin Liquid: Impaired Presentation: Cup;Straw (fed; 4 trials) Oral Phase Impairments: Poor awareness of bolus;Reduced lingual movement/coordination;Reduced labial seal (groping lingual movements at times; tongue upward) Oral Phase Functional Implications:  (spillage) Pharyngeal  Phase Impairments:  (none)    Nectar Thick Nectar Thick Liquid: Not tested   Honey Thick Honey Thick Liquid: Not tested   Puree Puree: Impaired Presentation: Spoon (fed; 4 trials) Oral Phase Impairments: Reduced labial seal;Reduced lingual movement/coordination;Poor awareness of bolus (same as w/ liquids) Oral Phase Functional Implications: Prolonged oral transit (min spillage) Pharyngeal Phase Impairments:  (none)   Solid   GO   Solid: Not tested       Jerilynn Som, MS, CCC-SLP  Watson,Katherine 11/07/2015,3:36 PM

## 2015-11-07 NOTE — Care Management (Signed)
Admitted to [facility] with the diagnosis of acute confusion (cirrhosis of liver). Lives with wife, Gillian Shieldsamela King [phone]). Last seen Dr. Debe CoderEmily Mullen 10/01/15 at [facility]. Looks like has been followed by Dr. Larwance SachsBabaoff in the past. Followed by [facility] at present.  Wound care consult in progress. Spoke with Dr. Orvan Falconerampbell this morning. States she has discussed the discharge option of going back home with [facility] in place. Family is in agreement with this plan Possible home today. Will need EMS transportation per Dayna BarkerKaren Robertson, RN representative for [facility]. Gwenette GreetBrenda S Minton RN MSN CCM Care Management [phone]

## 2015-11-07 NOTE — Plan of Care (Signed)
Problem: Education: Goal: Knowledge of [facility] Education information/materials will improve Outcome: Progressing Pt likes to be called Mark Stephens    Past Medical History: Past Medical History   Diagnosis  Date   .  Hypertension     .  Heart murmur     .  Atrial fibrillation (HCC)  11/27/14   .  Dementia     .  Dysrhythmia     .  Liver cirrhosis (HCC)     .  Cirrhosis of liver (HCC)     .  Ascites            Pt is well medicated with home meds

## 2015-11-07 NOTE — Discharge Summary (Signed)
[facility] [place] [facility]   PATIENT NAME: Mark Stephens    MR#:  [id number]  DATE OF BIRTH:  [birth year]  DATE OF ADMISSION:  11/06/2015 ADMITTING PHYSICIAN: Gery Pray, MD  DATE OF DISCHARGE: 11/07/15  PRIMARY CARE PHYSICIAN: Debe Coder, MD    ADMISSION DIAGNOSIS:  Leukocytosis [D72.829] Atrial fibrillation with rapid ventricular response (HCC) [I48.91] Ascites [R18.8] Altered mental status, unspecified altered mental status type [R41.82] End stage liver disease (HCC) [K72.90]  DISCHARGE DIAGNOSIS:  Active Problems:   Atrial fibrillation (HCC)   Hyperlipidemia   ETOHism (HCC)   Protein-calorie malnutrition, severe   Ascites due to alcoholic cirrhosis (HCC)   Hip pain, chronic   Acute confusional state   Pressure ulcer   SECONDARY DIAGNOSIS:   Past Medical History  Diagnosis Date  . Hypertension   . Heart murmur   . Atrial fibrillation (HCC) 11/27/14  . Dementia   . Dysrhythmia   . Liver cirrhosis (HCC)   . Cirrhosis of liver (HCC)   . Ascites     HOSPITAL COURSE:   82 year old man past medical history significant for end-stage liver cirrhosis, A. fib, hypertension, dementia, ascites presented to the hospital secondary to altered mental status.  #1 acute encephalopathy-secondary to hepatic encephalopathy. Patient already being followed by hospice. CT of the head negative for any acute stroke. -Seen by hospice and also palliative care consults in the hospital. -Patient is more alert and cooperative at this time. Plan is to discharge him home with hospice services.  #2 end-stage liver cirrhosis-continue Lasix and Aldactone. Significant ascites present. Tap as needed for comfort reasons.  #3 chronic pain-continue home pain medications.  Patient will be discharged home today with hospice services.\  DISCHARGE CONDITIONS:   Critical  CONSULTS OBTAINED:   Palliative care consult  DRUG ALLERGIES:  No Known  Allergies  DISCHARGE MEDICATIONS:   Current Discharge Medication List    START taking these medications   Details  bisacodyl (DULCOLAX) 10 MG suppository Place 1 suppository (10 mg total) rectally daily as needed for moderate constipation. Qty: 4 suppository, Refills: 0    Morphine Sulfate (MORPHINE CONCENTRATE) 10 MG/0.5ML SOLN concentrated solution Place 0.25 mLs (5 mg total) under the tongue every hour as needed for moderate pain, severe pain or shortness of breath. Qty: 30 mL, Refills: 0    prochlorperazine (COMPAZINE) 25 MG suppository Place 1 suppository (25 mg total) rectally every 8 (eight) hours as needed for nausea or vomiting. Qty: 2 suppository, Refills: 0      CONTINUE these medications which have NOT CHANGED   Details  fentaNYL (DURAGESIC - DOSED MCG/HR) 50 MCG/HR Place 1 patch (50 mcg total) onto the skin every 3 (three) days. Qty: 5 patch, Refills: 0    furosemide (LASIX) 20 MG tablet Take 40 mg by mouth daily.    hydrOXYzine (ATARAX/VISTARIL) 50 MG tablet Take 1 tablet (50 mg total) by mouth 3 (three) times daily as needed for anxiety or nausea. Qty: 30 tablet, Refills: 0    lactulose (CHRONULAC) 10 GM/15ML solution Take 45 mLs (30 g total) by mouth 2 (two) times daily. Qty: 240 mL, Refills: 3    LORazepam (ATIVAN) 0.5 MG tablet Take 1-2 tablets (0.5-1 mg total) by mouth every 4 (four) hours as needed for anxiety or sleep. Qty: 30 tablet, Refills: 5    spironolactone (ALDACTONE) 100 MG tablet Take 1 tablet (100 mg total) by mouth daily. Qty: 30 tablet, Refills: 11      STOP  taking these medications     HYDROcodone-acetaminophen (NORCO) 10-325 MG tablet      tamsulosin (FLOMAX) 0.4 MG CAPS capsule          DISCHARGE INSTRUCTIONS:   1. PCP f/u as scheduled 2. Home hospice services  If you experience worsening of your admission symptoms, develop shortness of breath, life threatening emergency, suicidal or homicidal thoughts you must seek medical  attention immediately by calling 911 or calling your MD immediately  if symptoms less severe.  You Must read complete instructions/literature along with all the possible adverse reactions/side effects for all the Medicines you take and that have been prescribed to you. Take any new Medicines after you have completely understood and accept all the possible adverse reactions/side effects.   Please note  You were cared for by a hospitalist during your hospital stay. If you have any questions about your discharge medications or the care you received while you were in the hospital after you are discharged, you can call the unit and asked to speak with the hospitalist on call if the hospitalist that took care of you is not available. Once you are discharged, your primary care physician will handle any further medical issues. Please note that NO REFILLS for any discharge medications will be authorized once you are discharged, as it is imperative that you return to your primary care physician (or establish a relationship with a primary care physician if you do not have one) for your aftercare needs so that they can reassess your need for medications and monitor your lab values.    Today   CHIEF COMPLAINT:   Chief Complaint  Patient presents with  . Altered Mental Status    VITAL SIGNS:  Blood pressure 108/66, pulse 108, temperature 97.5 F (36.4 C), temperature source Axillary, resp. rate 20, height 5\' 9"  (1.753 m), weight 58.514 kg (129 lb), SpO2 96 %.  I/O:   Intake/Output Summary (Last 24 hours) at 11/07/15 1535 Last data filed at 11/07/15 0318  Gross per 24 hour  Intake    300 ml  Output      0 ml  Net    300 ml    PHYSICAL EXAMINATION:   Physical Exam  GENERAL:  82 y.o.-year-old patient lying in the bed, ill appearing. cahectic EYES: Pupils equal, round, reactive to light and accommodation. positive scleral icterus. Extraocular muscles intact.  HEENT: Head atraumatic,  normocephalic. Oropharynx and nasopharynx clear.  NECK:  Supple, no jugular venous distention. No thyroid enlargement, no tenderness.  LUNGS: Normal breath sounds bilaterally, no wheezing, rales,rhonchi or crepitation. No use of accessory muscles of respiration.  CARDIOVASCULAR: S1, S2 normal. No murmurs, rubs, or gallops.  ABDOMEN: Soft, non-tender, distended, positive fluid thrill. Bowel sounds present. No organomegaly or mass.  EXTREMITIES: No pedal edema, cyanosis, or clubbing.  NEUROLOGIC: Cranial nerves II through XII are intact. Following commands. Sensation intact. Gait not checked. Severe global weakness PSYCHIATRIC: The patient is alert and oriented x 2.  SKIN: No obvious rash, lesion, or ulcer.   DATA REVIEW:   CBC  Recent Labs Lab 11/07/15 0441  WBC 13.4*  HGB 9.3*  HCT 26.9*  PLT 259    Chemistries   Recent Labs Lab 11/06/15 1754 11/07/15 0441  NA 133* 136  K 3.0* 4.2  CL 92* 93*  CO2 31 33*  GLUCOSE 126* 116*  BUN 16 18  CREATININE 1.14 1.19  CALCIUM 8.2* 8.5*  AST 37  --   ALT 11*  --  ALKPHOS 78  --   BILITOT 3.0*  --     Cardiac Enzymes No results for input(s): TROPONINI in the last 168 hours.  Microbiology Results  Results for orders placed or performed in visit on 08/06/15  Gram stain     Status: None   Collection Time: 08/06/15 10:30 AM  Result Value Ref Range Status   Specimen Description FLUID PARACENTESIS  Final   Special Requests NONE  Final   Gram Stain   Final    FEW WBC PRESENT,BOTH PMN AND MONONUCLEAR NO ORGANISMS SEEN    Report Status 08/06/2015 FINAL  Final  Culture, body fluid-bottle     Status: None   Collection Time: 08/06/15 10:30 AM  Result Value Ref Range Status   Specimen Description FLUID PARACENTESIS  Final   Special Requests NONE  Final   Culture NO GROWTH 5 DAYS  Final   Report Status 08/11/2015 FINAL  Final    RADIOLOGY:  Ct Head Wo Contrast  11/06/2015  CLINICAL DATA:  Possible stroke, altered for  approximately 3 hours. Patient under hospice care. EXAM: CT HEAD WITHOUT CONTRAST TECHNIQUE: Contiguous axial images were obtained from the base of the skull through the vertex without intravenous contrast. COMPARISON:  Head CTs dated 03/10/2015 and 02/03/2015. Also brain MRI dated 08/09/2014. FINDINGS: Brain: Again noted is generalized age-related brain atrophy with commensurate dilatation of the ventricles and sulci. Mild chronic small vessel ischemic change again noted within the bilateral periventricular white matter regions. All other areas of the brain demonstrate normal gray-white matter attenuation. There is no mass, hemorrhage, edema or other evidence of acute parenchymal abnormality identified. No extra-axial hemorrhage. Vascular: No hyperdense vessel or unexpected calcification. There are chronic calcified atherosclerotic changes of the large vessels at the skull base. Skull: Negative for fracture or focal lesion. Sinuses/Orbits: No acute findings. Visualized upper paranasal sinuses are clear. Other: None. IMPRESSION: 1. No acute findings.  No intracranial mass, hemorrhage or edema. 2. Age-related brain atrophy and chronic ischemic changes in the white matter, similar to previous exams. Electronically Signed   By: Bary Richard M.D.   On: 11/06/2015 19:12    EKG:   Orders placed or performed during the hospital encounter of 11/06/15  . EKG 12-Lead  . EKG 12-Lead      Management plans discussed with the patient, family and they are in agreement.  CODE STATUS:     Code Status Orders        Start     Ordered   11/06/15 2159  Do not attempt resuscitation (DNR)   Continuous    Question Answer Comment  In the event of cardiac or respiratory ARREST Do not call a "code blue"   In the event of cardiac or respiratory ARREST Do not perform Intubation, CPR, defibrillation or ACLS   In the event of cardiac or respiratory ARREST Use medication by any route, position, wound care, and other  measures to relive pain and suffering. May use oxygen, suction and manual treatment of airway obstruction as needed for comfort.      11/06/15 2158    Code Status History    Date Active Date Inactive Code Status Order ID Comments User Context   11/06/2015  6:26 PM 11/06/2015  9:58 PM DNR [id number]  Loleta Rose, MD ED   03/10/2015 11:02 PM 03/14/2015  6:54 PM DNR [id number]  Hyacinth Meeker, MD Inpatient    Advance Directive Documentation        Most Recent Value  Type of Advance Directive  Healthcare Power of Attorney, Living will   Pre-existing out of facility DNR order (yellow form or pink MOST form)     "MOST" Form in Place?        TOTAL TIME TAKING CARE OF THIS PATIENT: 37 minutes.    Enid BaasKALISETTI,Derek Huneycutt M.D on 11/07/2015 at 3:35 PM  Between 7am to 6pm - Pager - [phone]  After 6pm go to [email or web address] - password [email or web address] [facility]  [facility]  Office  [phone]  CC: Primary care physician; Debe CoderMULLEN, EMILY, MD

## 2015-11-07 NOTE — Progress Notes (Addendum)
Palliative Care Update   I have seen pt and talked with his sister-in-law, Nestor RampVicki Kimball, who was bedside.  Pt is now opening his mouth some.  But, he is NOT a good candidate to continue taking his 9-15 pills per day that are in the current orders.  I was asked by nursing to address the oral meds to see if we can minimize these and also limit meds to either liquids or crushables.  I mentioned to the sister-in-law that I would be changing his meds.  I have also spoken with Hospice Liaison about pt since he is followed by Hospice in his home. I mentioned to the sister-in-law that there are no Hospice Home beds and may not be any available for some time. She seemed to think it would be fine for pt to return home --with extra family pitching in to help pts wife.  But, of course that has to be worked out with pt's wife  -- I let Hospice liaison know.  I would avoid Haldol if possible, as his presentation could be related to a dyskinesia or dystonia that might possibly be related to / worsened by  Haldol use. I have changed some meds to solutions and have DCd the Flomax capsule. He may require a foley w/o the Flomax.    Pt is not necessarily imminently terminal. It may depend on his oral intake of food.  He is opening his mouth now.  He may return home back under Hospice care soon (there are no Hospice Home beds available and it could be days before there are beds).  Will work with Hospice Liaison and appreciate her involvement.   Suan HalterMargaret F Berry Godsey, MD

## 2015-11-07 NOTE — Progress Notes (Signed)
Pt is d/ced home and is already followed by hospice.  Pt had swallow eval today and palliative consult to reevaluate home meds. Pt also seen by Wound Nurse.  Pt has tongue sticking out and cognitively has difficulty w/how to take oral meds.  Literally had to gently press tongue down to give meds crushed in ice cream. CT of head was negative for CVA.  Would clamp down on swabs and straws and it would be difficult to remove.  IV removed. Script for oral morphine and biscodyl suppository given.  Pt was transported by EMS to home.

## 2015-11-10 ENCOUNTER — Encounter: Payer: Self-pay | Admitting: Internal Medicine

## 2015-11-10 ENCOUNTER — Ambulatory Visit: Payer: Medicare Other | Admitting: Pulmonary Disease

## 2015-11-29 DEATH — deceased

## 2017-07-30 IMAGING — CR DG CHEST 2V
2 series · 2 of 2 positions shown · non-contrast
Comparison: 06/19/2009

CLINICAL DATA: Increasing hallucinations. Bilateral foot swelling.
Altered mental status.

EXAM:
CHEST  2 VIEW

[chest lat]
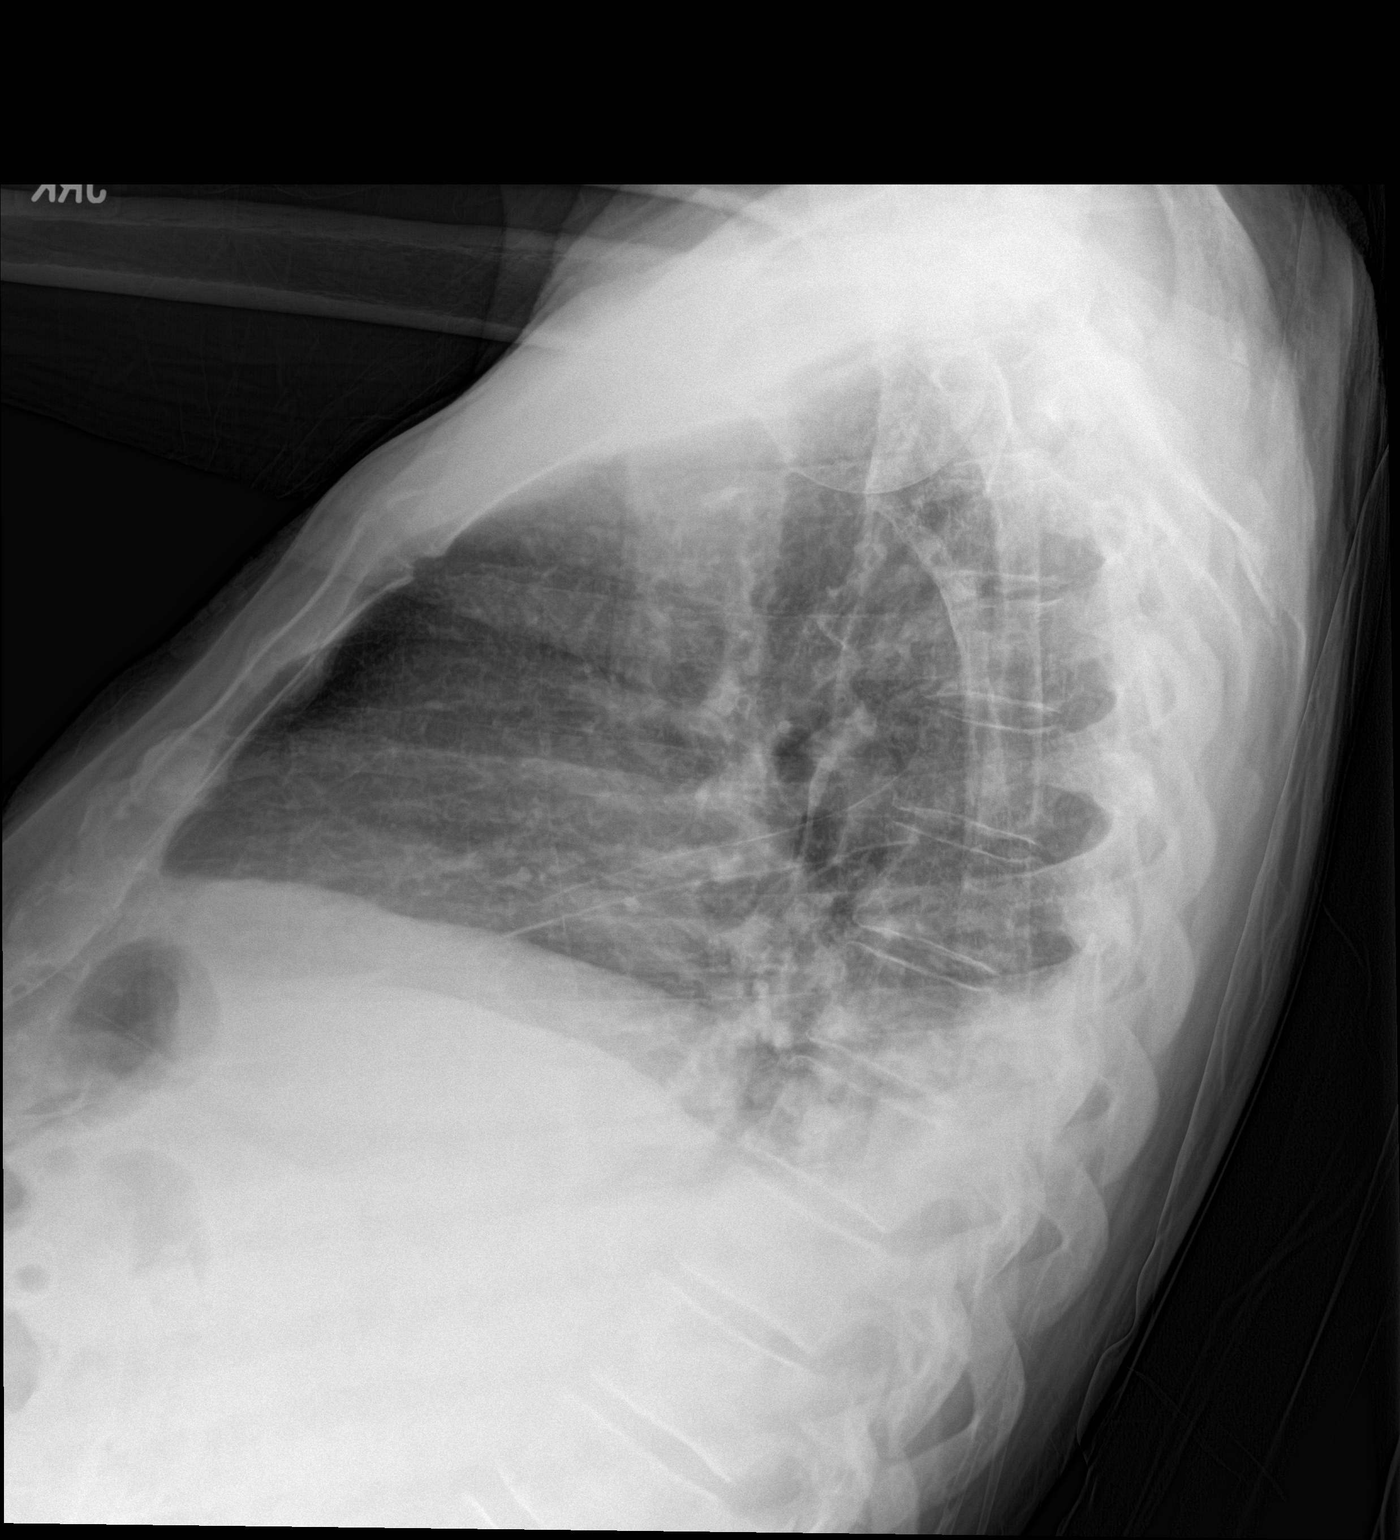

[chest ap]
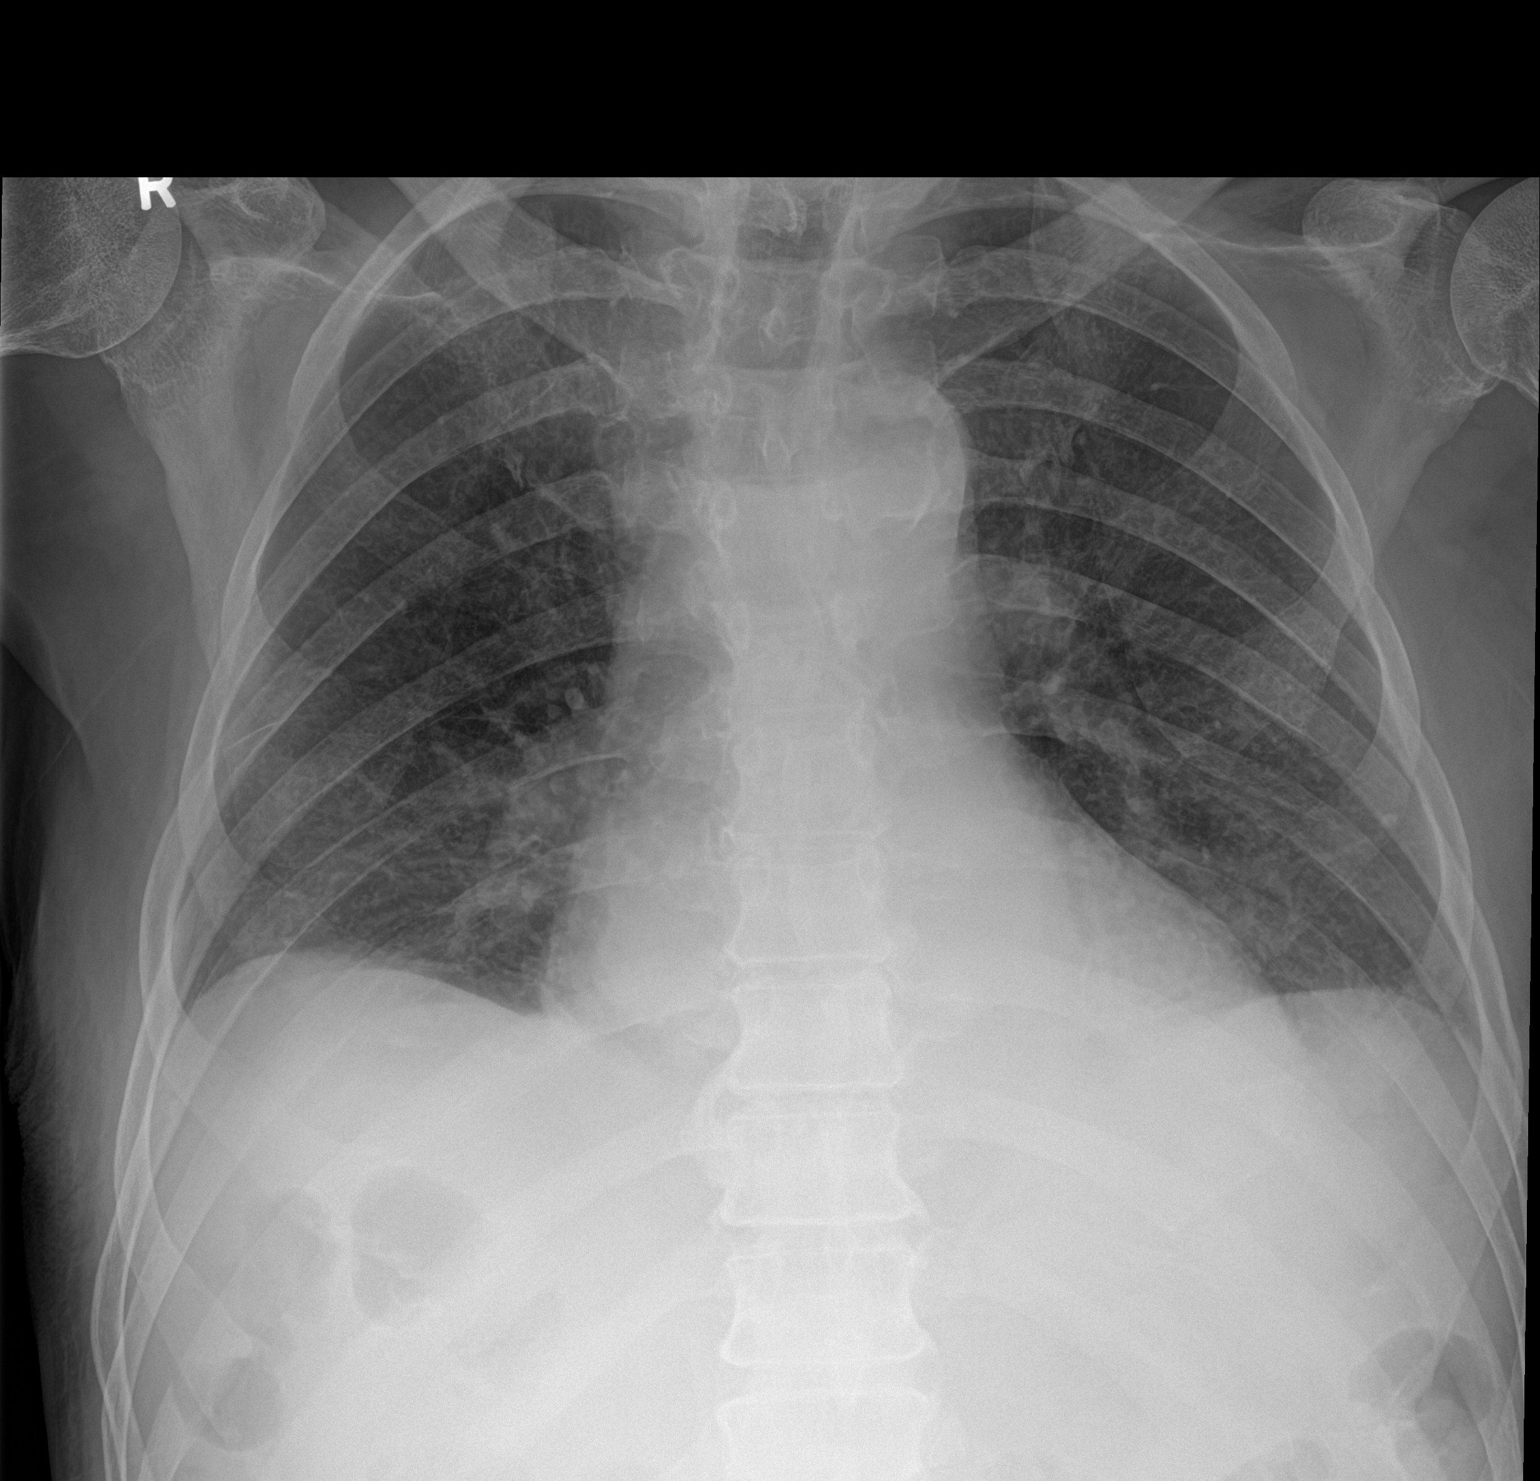

[2 of 2 positions shown; findings below may reference images not displayed]

FINDINGS: Cardiac enlargement without vascular congestion. Shallow
inspiration. Small bilateral pleural effusions with basilar
atelectasis. No pneumothorax. Calcification of the aorta.
IMPRESSION: Cardiac enlargement. Small bilateral pleural effusions with basilar
atelectasis.

## 2017-07-30 IMAGING — CT CT HEAD W/O CM
1 of 2 series · 16 of 30 positions shown, 20 images · non-contrast
Comparison: MRI brain 08/09/2014

CLINICAL DATA: Altered mental status with increased confusion and
history of falls. Hallucinations.

EXAM:
CT HEAD WITHOUT CONTRAST
TECHNIQUE: Contiguous axial images were obtained from the base of the skull
through the vertex without intravenous contrast.

[Series 2: head wo · axial · 0.44mm/px · z∈[-165,-21]mm · 16 of 36 slices shown, 20 images]
[im 2/36  brain]
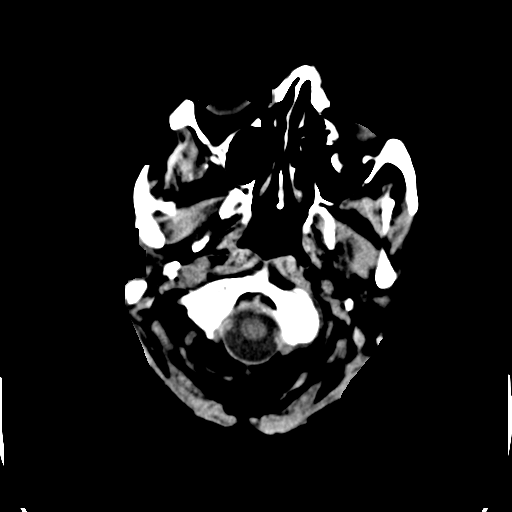
[im 2/36  bone]
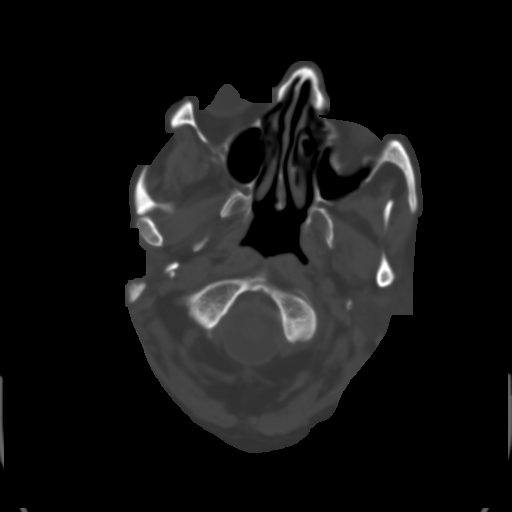
[im 4/36  brain]
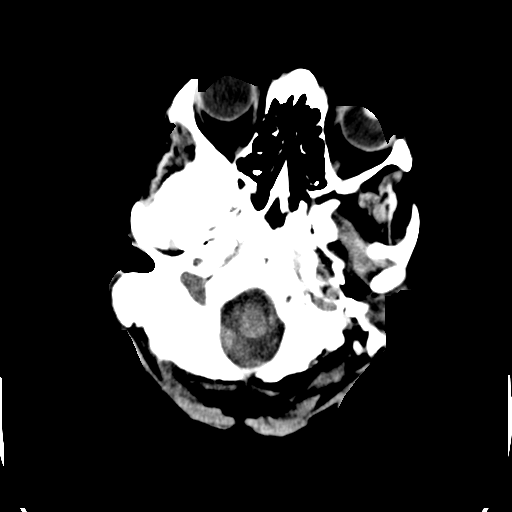
[im 6/36  brain]
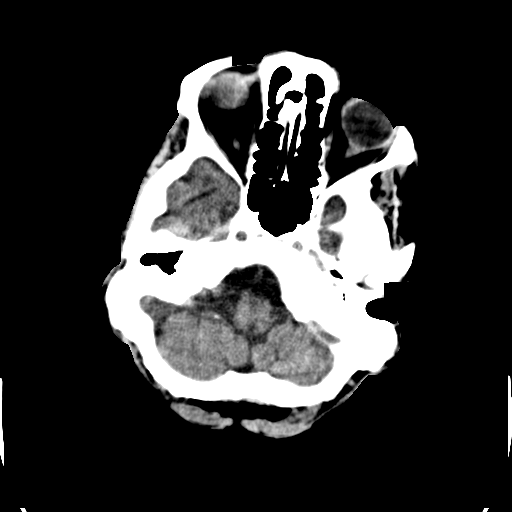
[im 9/36  brain]
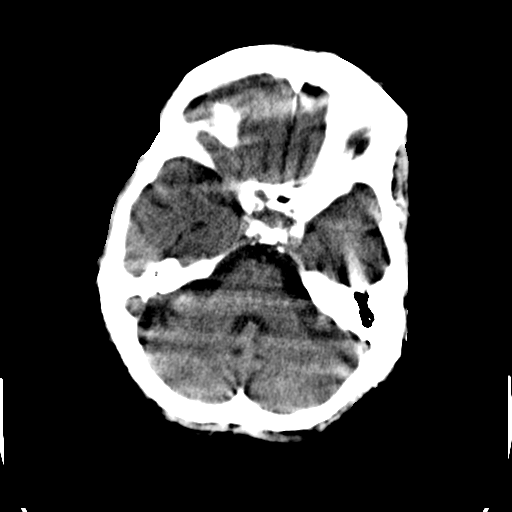
[im 11/36  brain]
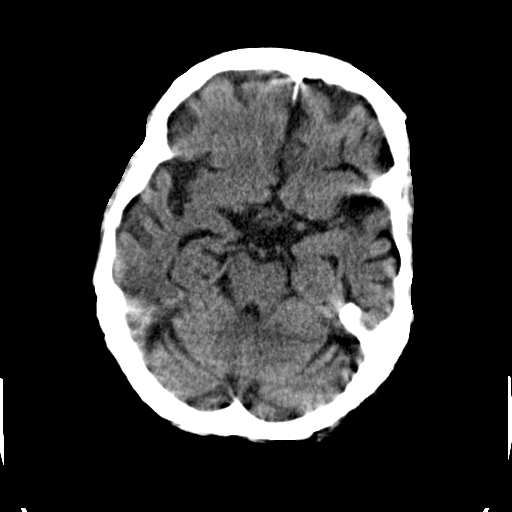
[im 11/36  bone]
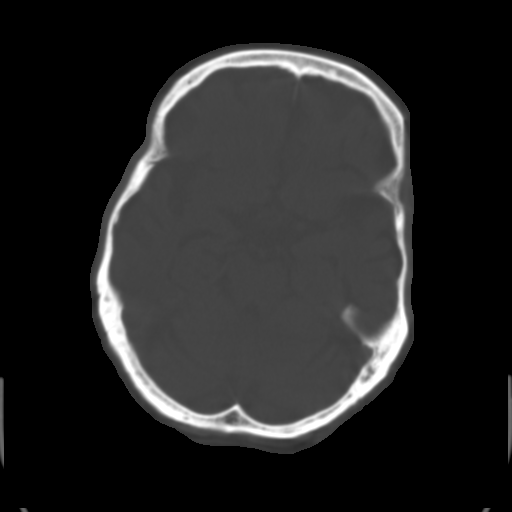
[im 13/36  brain]
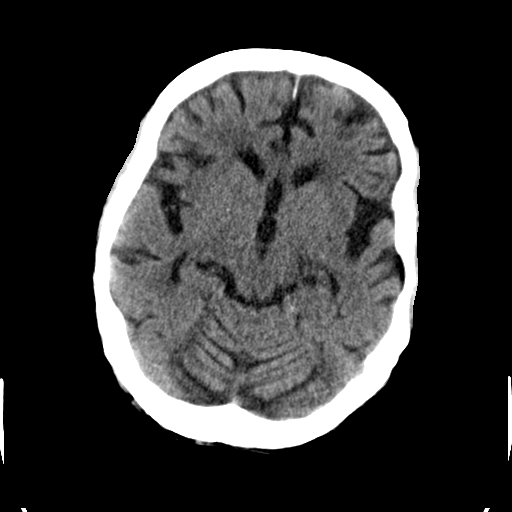
[im 15/36  brain]
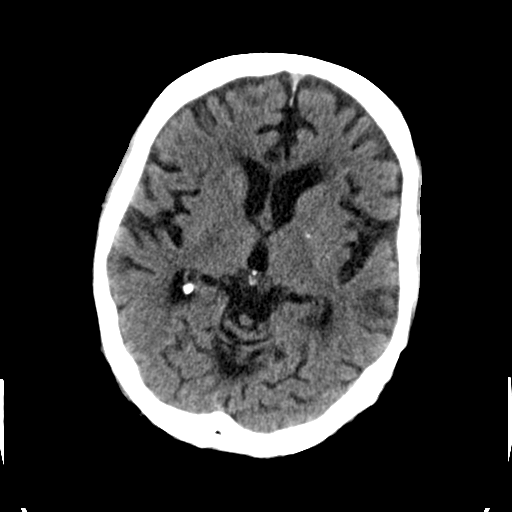
[im 16/36  brain]
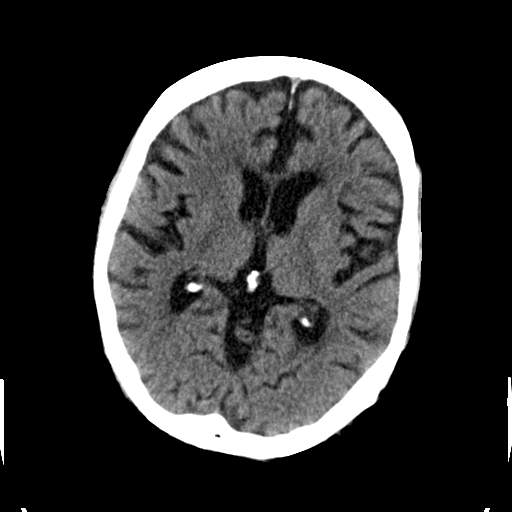
[im 20/36  brain]
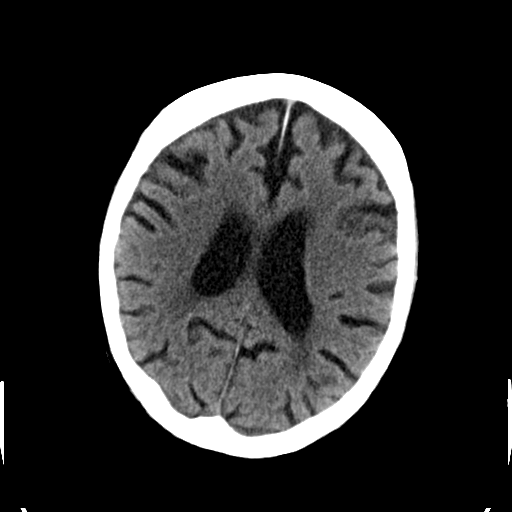
[im 20/36  bone]
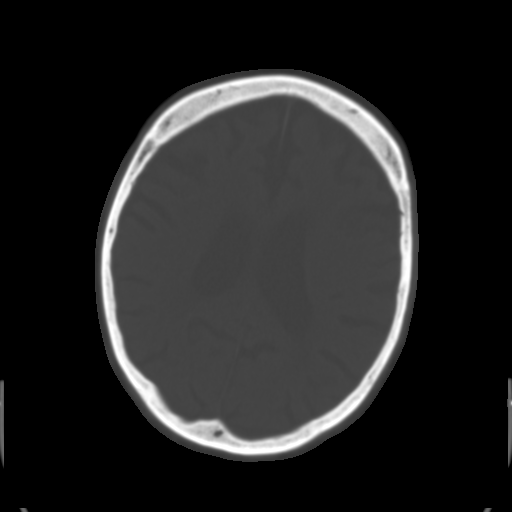
[im 22/36  brain]
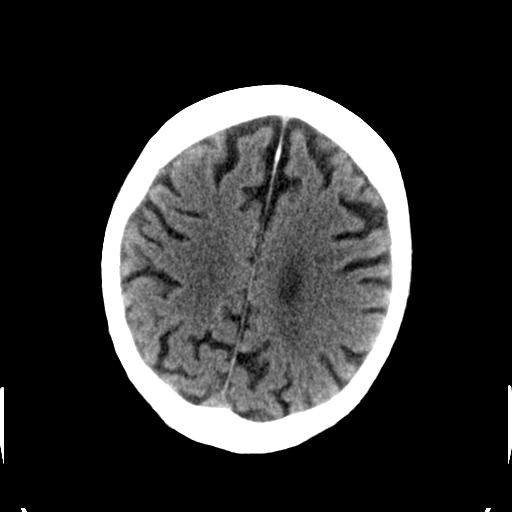
[im 23/36  brain]
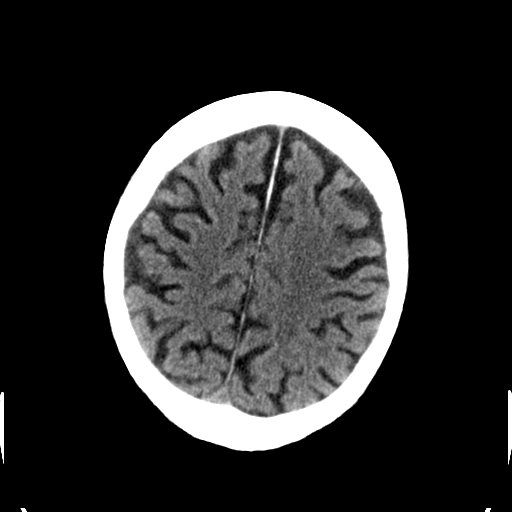
[im 25/36  brain]
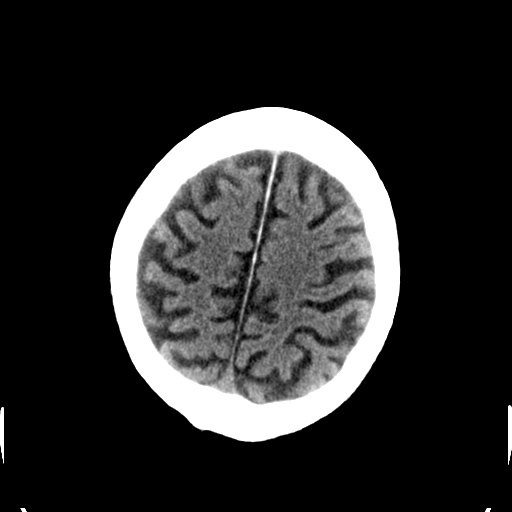
[im 27/36  brain]
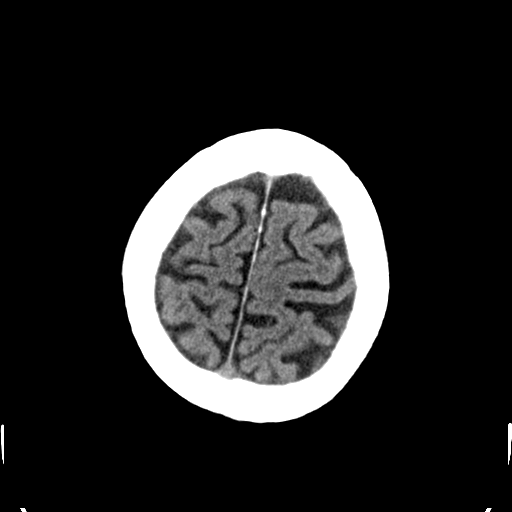
[im 27/36  bone]
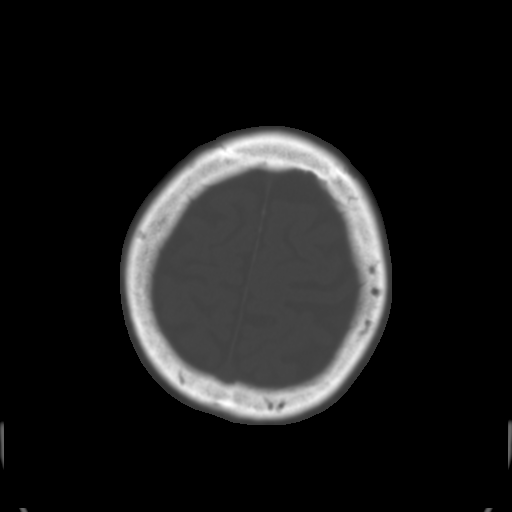
[im 30/36  brain]
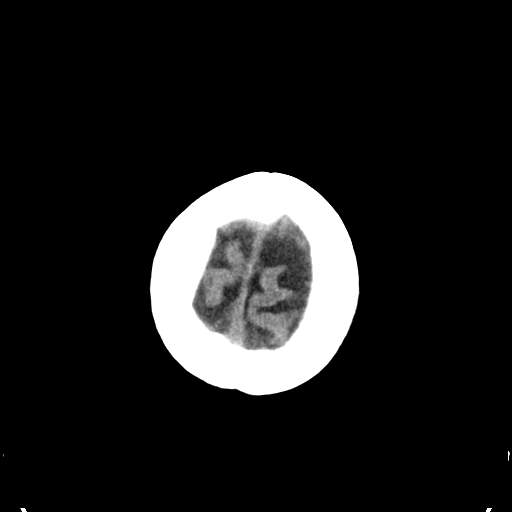
[im 32/36  brain]
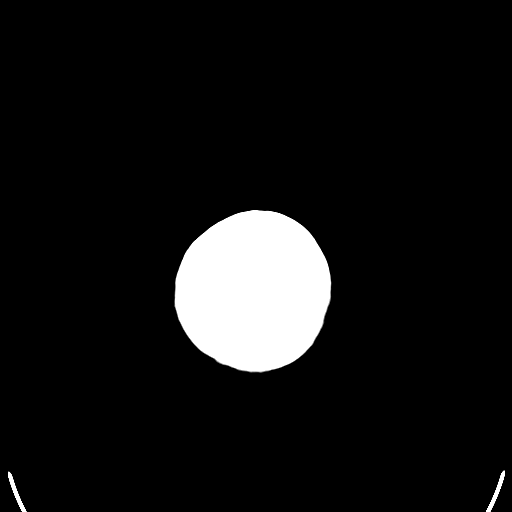
[im 34/36  brain]
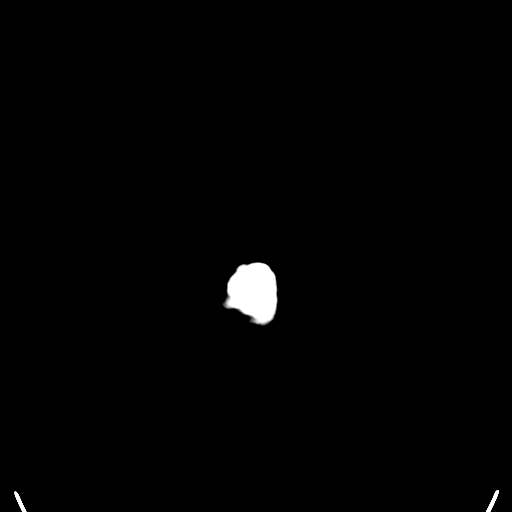

[16 of 30 positions shown; findings below may reference images not displayed]

FINDINGS: Technically limited study due to motion artifact. Diffuse cerebral
atrophy. Mild ventricular dilatation consistent with central
atrophy. Patchy low-attenuation changes in the deep white matter
consistent with small vessel ischemia. No mass effect or midline
shift. No abnormal extra-axial fluid collections. Gray-white matter
junctions are distinct. Basal cisterns are not effaced. No evidence
of acute intracranial hemorrhage. No depressed skull fractures.
Visualized paranasal sinuses and mastoid air cells are not
opacified.
IMPRESSION: No acute intracranial abnormalities. Chronic atrophy and small
vessel ischemic changes.
